# Patient Record
Sex: Male | Born: 1970 | Race: Black or African American | Hispanic: No | Marital: Single | Smoking: Current every day smoker
Health system: Southern US, Community
[De-identification: ages and names within clinical notes are randomized; demographics above are authoritative.]

---

## 2003-10-15 ENCOUNTER — Emergency Department (HOSPITAL_COMMUNITY): Admission: EM | Admit: 2003-10-15 | Discharge: 2003-10-15 | Payer: Self-pay | Admitting: Emergency Medicine

## 2003-11-17 ENCOUNTER — Emergency Department (HOSPITAL_COMMUNITY): Admission: EM | Admit: 2003-11-17 | Discharge: 2003-11-18 | Payer: Self-pay | Admitting: *Deleted

## 2003-12-10 ENCOUNTER — Emergency Department (HOSPITAL_COMMUNITY): Admission: EM | Admit: 2003-12-10 | Discharge: 2003-12-10 | Payer: Self-pay | Admitting: Emergency Medicine

## 2007-02-18 ENCOUNTER — Emergency Department (HOSPITAL_COMMUNITY): Admission: EM | Admit: 2007-02-18 | Discharge: 2007-02-18 | Payer: Self-pay | Admitting: Emergency Medicine

## 2007-09-09 ENCOUNTER — Emergency Department (HOSPITAL_COMMUNITY): Admission: EM | Admit: 2007-09-09 | Discharge: 2007-09-09 | Payer: Self-pay | Admitting: Emergency Medicine

## 2009-01-08 ENCOUNTER — Emergency Department (HOSPITAL_COMMUNITY): Admission: EM | Admit: 2009-01-08 | Discharge: 2009-01-08 | Payer: Self-pay | Admitting: Pediatrics

## 2011-05-13 ENCOUNTER — Emergency Department (HOSPITAL_COMMUNITY)
Admission: EM | Admit: 2011-05-13 | Discharge: 2011-05-13 | Disposition: A | Discharge status: Home / Self Care | Payer: Self-pay | Attending: Emergency Medicine | Admitting: Emergency Medicine

## 2011-05-13 ENCOUNTER — Emergency Department (HOSPITAL_COMMUNITY): Payer: Self-pay

## 2011-05-13 ENCOUNTER — Encounter (HOSPITAL_COMMUNITY): Payer: Self-pay | Admitting: Emergency Medicine

## 2011-05-13 DIAGNOSIS — K089 Disorder of teeth and supporting structures, unspecified: Secondary | ICD-10-CM | POA: Insufficient documentation

## 2011-05-13 DIAGNOSIS — F17200 Nicotine dependence, unspecified, uncomplicated: Secondary | ICD-10-CM | POA: Insufficient documentation

## 2011-05-13 DIAGNOSIS — K029 Dental caries, unspecified: Secondary | ICD-10-CM | POA: Insufficient documentation

## 2011-05-13 DIAGNOSIS — K047 Periapical abscess without sinus: Secondary | ICD-10-CM | POA: Insufficient documentation

## 2011-05-13 MED ORDER — PENICILLIN V POTASSIUM 250 MG PO TABS
500.0000 mg | ORAL_TABLET | Freq: Once | ORAL | Status: AC
  Administered 2011-05-13: 500 mg via ORAL
  Filled 2011-05-13: qty 2

## 2011-05-13 MED ORDER — HYDROCODONE-ACETAMINOPHEN 5-325 MG PO TABS: 2.0000 | ORAL_TABLET | ORAL | Status: AC | PRN

## 2011-05-13 MED ORDER — PENICILLIN V POTASSIUM 500 MG PO TABS: 500.0000 mg | ORAL_TABLET | Freq: Four times a day (QID) | ORAL | Status: AC

## 2011-05-13 MED ORDER — OXYCODONE-ACETAMINOPHEN 5-325 MG PO TABS
1.0000 | ORAL_TABLET | Freq: Once | ORAL | Status: AC
  Administered 2011-05-13: 1 via ORAL
  Filled 2011-05-13: qty 1

## 2011-05-13 NOTE — ED Notes (Signed)
Pt sts dental abscess to right lower side that has purulent discharge starting yesterday; pt c/o pain on side of face

## 2011-05-13 NOTE — ED Provider Notes (Signed)
History     CSN: [id number]  Arrival date & time 05/13/11  6213   First MD Initiated Contact with Patient 05/13/11 0800      Chief Complaint  Patient presents with  . Dental Pain  . Abscess    (Consider location/radiation/quality/duration/timing/severity/associated sxs/prior treatment) HPI  41 year old male presenting to the ED with chief complaints of dental abscess. Patient states for the past 2 days he has been experiencing pain to the right side of his face. Pain initially started at his right upper premolar. Patient describes as sharp, and throbbing. Pain eventually affecting his gum and then radiating upward to the sinuses. He also noticed abnormal taste in mouth from the drainage which he thinks likely pus. Pain has gotten worse and with associated headache, chills, neck pain, body aches. Increasing pain with chewing or with movement. He has not tried anything to alleviate the symptoms. Patient denies ear pain, sore throat, chest pain, shortness of breath, rash. He denies any recent trauma.  History reviewed. No pertinent past medical history.  History reviewed. No pertinent past surgical history.  History reviewed. No pertinent family history.  History  Substance Use Topics  . Smoking status: Current Everyday Smoker  . Smokeless tobacco: Not on file  . Alcohol Use: Yes      Review of Systems  All other systems reviewed and are negative.    Allergies  Review of patient's allergies indicates no known allergies.  Home Medications  No current outpatient prescriptions on file.  BP 138/81  Pulse 100  Temp(Src) 98.6 F (37 C) (Oral)  Resp 20  SpO2 98%  Physical Exam  Nursing note and vitals reviewed. Constitutional: He appears well-developed and well-nourished. No distress.       Awake, alert, nontoxic appearance  HENT:  Head: Normocephalic and atraumatic.    Right Ear: External ear normal.  Left Ear: External ear normal.  Mouth/Throat: Oropharynx is  clear and moist.    Eyes: Right eye exhibits no discharge. Left eye exhibits no discharge.  Neck: Neck supple.  Pulmonary/Chest: Effort normal. He exhibits no tenderness.  Abdominal: There is no tenderness. There is no rebound.  Musculoskeletal: He exhibits no tenderness.       Baseline ROM, no obvious new focal weakness  Neurological:       Mental status and motor strength appears baseline for patient and situation  Skin: No rash noted.  Psychiatric: He has a normal mood and affect.    ED Course  Procedures (including critical care time)  Labs Reviewed - No data to display No results found.   No diagnosis found.    MDM  The symptoms suggestive of an apical abscess with possible sinus involvement. Patient is currently afebrile. He does have moderate tenderness on percussion to maxillary sinus. Patient has significant dental decay, and some evidence of gingivitis. Panorex x-ray ordered. Penicillin and pain medication given.   10:30 AM Panorex reveals significant dental caries and suspicious finding consistent with periapical abscess to multiple teeth.  Result explained to pt.  Pt is instructed for further f/u with dentist.      Fayrene Helper, PA-C 05/13/11 1031

## 2011-05-13 NOTE — ED Provider Notes (Signed)
Medical screening examination/treatment/procedure(s) were performed by non-physician practitioner and as supervising physician I was immediately available for consultation/collaboration.   Forbes Cellar, MD 05/13/11 1044

## 2011-08-03 ENCOUNTER — Emergency Department (HOSPITAL_COMMUNITY)
Admission: EM | Admit: 2011-08-03 | Discharge: 2011-08-03 | Disposition: A | Discharge status: Home / Self Care | Payer: No Typology Code available for payment source | Attending: Emergency Medicine | Admitting: Emergency Medicine

## 2011-08-03 ENCOUNTER — Encounter (HOSPITAL_COMMUNITY): Payer: Self-pay

## 2011-08-03 ENCOUNTER — Emergency Department (HOSPITAL_COMMUNITY): Payer: No Typology Code available for payment source

## 2011-08-03 DIAGNOSIS — IMO0002 Reserved for concepts with insufficient information to code with codable children: Secondary | ICD-10-CM | POA: Insufficient documentation

## 2011-08-03 DIAGNOSIS — S134XXA Sprain of ligaments of cervical spine, initial encounter: Secondary | ICD-10-CM

## 2011-08-03 DIAGNOSIS — J45909 Unspecified asthma, uncomplicated: Secondary | ICD-10-CM | POA: Insufficient documentation

## 2011-08-03 DIAGNOSIS — S46919A Strain of unspecified muscle, fascia and tendon at shoulder and upper arm level, unspecified arm, initial encounter: Secondary | ICD-10-CM

## 2011-08-03 DIAGNOSIS — Y92410 Unspecified street and highway as the place of occurrence of the external cause: Secondary | ICD-10-CM | POA: Insufficient documentation

## 2011-08-03 MED ORDER — NAPROXEN 375 MG PO TABS: 375.0000 mg | ORAL_TABLET | Freq: Two times a day (BID) | ORAL | Status: DC

## 2011-08-03 MED ORDER — HYDROMORPHONE HCL PF 1 MG/ML IJ SOLN
1.0000 mg | Freq: Once | INTRAMUSCULAR | Status: AC
  Administered 2011-08-03: 1 mg via INTRAMUSCULAR
  Filled 2011-08-03: qty 1

## 2011-08-03 MED ORDER — HYDROCODONE-ACETAMINOPHEN 5-500 MG PO TABS: 1.0000 | ORAL_TABLET | Freq: Four times a day (QID) | ORAL | Status: AC | PRN

## 2011-08-03 MED ORDER — DIAZEPAM 5 MG PO TABS
10.0000 mg | ORAL_TABLET | Freq: Once | ORAL | Status: AC
  Administered 2011-08-03: 10 mg via ORAL
  Filled 2011-08-03: qty 2

## 2011-08-03 MED ORDER — DIAZEPAM 5 MG PO TABS: ORAL_TABLET | ORAL | Status: AC

## 2011-08-03 MED ORDER — ONDANSETRON 4 MG PO TBDP
4.0000 mg | ORAL_TABLET | Freq: Once | ORAL | Status: AC
  Administered 2011-08-03: 4 mg via ORAL
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
## 2011-08-04 NOTE — ED Provider Notes (Signed)
Medical screening examination/treatment/procedure(s) were performed by non-physician practitioner and as supervising physician I was immediately available for consultation/collaboration.   Elmon Shader, MD 08/04/11 0033 

## 2012-04-21 ENCOUNTER — Emergency Department (HOSPITAL_COMMUNITY)
Admission: EM | Admit: 2012-04-21 | Discharge: 2012-04-21 | Disposition: A | Discharge status: Home / Self Care | Payer: Self-pay | Attending: Emergency Medicine | Admitting: Emergency Medicine

## 2012-04-21 ENCOUNTER — Emergency Department (HOSPITAL_COMMUNITY): Payer: Self-pay

## 2012-04-21 ENCOUNTER — Encounter (HOSPITAL_COMMUNITY): Payer: Self-pay | Admitting: *Deleted

## 2012-04-21 DIAGNOSIS — Y9389 Activity, other specified: Secondary | ICD-10-CM | POA: Insufficient documentation

## 2012-04-21 DIAGNOSIS — K029 Dental caries, unspecified: Secondary | ICD-10-CM | POA: Insufficient documentation

## 2012-04-21 DIAGNOSIS — J029 Acute pharyngitis, unspecified: Secondary | ICD-10-CM

## 2012-04-21 DIAGNOSIS — IMO0002 Reserved for concepts with insufficient information to code with codable children: Secondary | ICD-10-CM | POA: Insufficient documentation

## 2012-04-21 DIAGNOSIS — Y9289 Other specified places as the place of occurrence of the external cause: Secondary | ICD-10-CM | POA: Insufficient documentation

## 2012-04-21 DIAGNOSIS — J45909 Unspecified asthma, uncomplicated: Secondary | ICD-10-CM | POA: Insufficient documentation

## 2012-04-21 DIAGNOSIS — H9201 Otalgia, right ear: Secondary | ICD-10-CM

## 2012-04-21 DIAGNOSIS — H9209 Otalgia, unspecified ear: Secondary | ICD-10-CM | POA: Insufficient documentation

## 2012-04-21 DIAGNOSIS — F17200 Nicotine dependence, unspecified, uncomplicated: Secondary | ICD-10-CM | POA: Insufficient documentation

## 2012-04-21 DIAGNOSIS — M542 Cervicalgia: Secondary | ICD-10-CM | POA: Insufficient documentation

## 2012-04-21 DIAGNOSIS — Z79899 Other long term (current) drug therapy: Secondary | ICD-10-CM | POA: Insufficient documentation

## 2012-04-21 DIAGNOSIS — R51 Headache: Secondary | ICD-10-CM | POA: Insufficient documentation

## 2012-04-21 DIAGNOSIS — T169XXA Foreign body in ear, unspecified ear, initial encounter: Secondary | ICD-10-CM | POA: Insufficient documentation

## 2012-04-21 MED ORDER — HYDROCODONE-ACETAMINOPHEN 7.5-500 MG/15ML PO SOLN
10.0000 mL | Freq: Once | ORAL | Status: AC
  Administered 2012-04-21: 10 mL via ORAL
  Filled 2012-04-21: qty 15

## 2012-04-21 MED ORDER — HYDROCODONE-ACETAMINOPHEN 7.5-500 MG/15ML PO SOLN: 15.0000 mL | Freq: Four times a day (QID) | ORAL | Status: DC | PRN
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
## 2012-04-21 NOTE — ED Provider Notes (Signed)
Medical screening examination/treatment/procedure(s) were performed by non-physician practitioner and as supervising physician I was immediately available for consultation/collaboration.   Flint Melter, MD 04/21/12 2325
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
## 2012-10-29 ENCOUNTER — Encounter (HOSPITAL_COMMUNITY): Payer: Self-pay | Admitting: Emergency Medicine

## 2012-10-29 ENCOUNTER — Emergency Department (HOSPITAL_COMMUNITY): Payer: Self-pay

## 2012-10-29 ENCOUNTER — Emergency Department (HOSPITAL_COMMUNITY)
Admission: EM | Admit: 2012-10-29 | Discharge: 2012-10-29 | Disposition: A | Discharge status: Home / Self Care | Payer: Self-pay | Attending: Emergency Medicine | Admitting: Emergency Medicine

## 2012-10-29 DIAGNOSIS — J45909 Unspecified asthma, uncomplicated: Secondary | ICD-10-CM | POA: Insufficient documentation

## 2012-10-29 DIAGNOSIS — Y939 Activity, unspecified: Secondary | ICD-10-CM | POA: Insufficient documentation

## 2012-10-29 DIAGNOSIS — S6390XA Sprain of unspecified part of unspecified wrist and hand, initial encounter: Secondary | ICD-10-CM | POA: Insufficient documentation

## 2012-10-29 DIAGNOSIS — K029 Dental caries, unspecified: Secondary | ICD-10-CM

## 2012-10-29 DIAGNOSIS — S63619A Unspecified sprain of unspecified finger, initial encounter: Secondary | ICD-10-CM

## 2012-10-29 DIAGNOSIS — Y929 Unspecified place or not applicable: Secondary | ICD-10-CM | POA: Insufficient documentation

## 2012-10-29 DIAGNOSIS — W010XXA Fall on same level from slipping, tripping and stumbling without subsequent striking against object, initial encounter: Secondary | ICD-10-CM | POA: Insufficient documentation

## 2012-10-29 DIAGNOSIS — Z79899 Other long term (current) drug therapy: Secondary | ICD-10-CM | POA: Insufficient documentation

## 2012-10-29 DIAGNOSIS — K089 Disorder of teeth and supporting structures, unspecified: Secondary | ICD-10-CM | POA: Insufficient documentation

## 2012-10-29 DIAGNOSIS — F17200 Nicotine dependence, unspecified, uncomplicated: Secondary | ICD-10-CM | POA: Insufficient documentation

## 2012-10-29 MED ORDER — ETODOLAC 500 MG PO TABS: 500.0000 mg | ORAL_TABLET | Freq: Two times a day (BID) | ORAL | Status: DC

## 2012-10-29 MED ORDER — HYDROCODONE-ACETAMINOPHEN 5-325 MG PO TABS
1.0000 | ORAL_TABLET | Freq: Once | ORAL | Status: AC
  Administered 2012-10-29: 1 via ORAL
  Filled 2012-10-29: qty 1

## 2012-10-29 MED ORDER — PENICILLIN V POTASSIUM 500 MG PO TABS: 500.0000 mg | ORAL_TABLET | Freq: Three times a day (TID) | ORAL | Status: DC

## 2012-10-29 NOTE — ED Provider Notes (Signed)
History    CSN: [id number] Arrival date & time 10/29/12  1022 First MD Initiated Contact with Patient 10/29/12 1030     Chief Complaint  Patient presents with  . Dental Pain  . Finger Injury    HPI Patient presents to the emergency room primarily with complaints of finger pain. Patient states he tripped and fell yesterday catching himself with his right hand. Since that fall he has experienced pain in his right thumb as well as his right ring finger. Pain is moderate. Movement and palpation increases the discomfort. He has not noticed any particular swelling. Is not any numbness.  Patient also mentions having trouble with a toothache for the last several weeks. He has not seen a dentist for this trouble.  He does not have any difficulty swallowing or breathing. He has not noticed any facial swelling. He feels like all his teeth are sore as well as his gums  Past Medical History  Diagnosis Date  . Asthma    History reviewed. No pertinent past surgical history. No family history on file. History  Substance Use Topics  . Smoking status: Current Every Day Smoker -- 0.25 packs/day    Types: Cigarettes  . Smokeless tobacco: Not on file  . Alcohol Use: Yes     Comment: occasionally    Review of Systems  All other systems reviewed and are negative.    Allergies  Bee venom and Shrimp  Home Medications   Current Outpatient Rx  Name  Route  Sig  Dispense  Refill  . albuterol (PROVENTIL HFA;VENTOLIN HFA) 108 (90 BASE) MCG/ACT inhaler   Inhalation   Inhale 2 puffs into the lungs every 6 (six) hours as needed. For shortness of breath.         . etodolac (LODINE) 500 MG tablet   Oral   Take 1 tablet (500 mg total) by mouth 2 (two) times daily.   20 tablet   0   . penicillin v potassium (VEETID) 500 MG tablet   Oral   Take 1 tablet (500 mg total) by mouth 3 (three) times daily.   30 tablet   0    BP 129/89  Pulse 64  Temp(Src) 97.6 F (36.4 C) (Oral)  Resp 20  SpO2  97% Physical Exam  Nursing note and vitals reviewed. Constitutional: He appears well-developed and well-nourished. No distress.  HENT:  Head: Normocephalic and atraumatic.  Right Ear: External ear normal.  Left Ear: External ear normal.  Mouth/Throat: Oropharynx is clear and moist and mucous membranes are normal. No oral lesions. Dental caries present. No dental abscesses or edematous.  Eyes: Conjunctivae are normal. Right eye exhibits no discharge. Left eye exhibits no discharge. No scleral icterus.  Neck: Neck supple. No tracheal deviation present.  Cardiovascular: Normal rate.   Pulmonary/Chest: Effort normal. No stridor. No respiratory distress.  Musculoskeletal: He exhibits no edema.       Hands: Neurological: He is alert. Cranial nerve deficit: no gross deficits.  Skin: Skin is warm and dry. No rash noted.  Psychiatric: He has a normal mood and affect.    ED Course  Procedures (including critical care time) Labs Reviewed - No data to display Dg Hand Complete Right  10/29/2012   *RADIOLOGY REPORT*  Clinical Data: Pain post trauma  RIGHT HAND - COMPLETE 3+ VIEW  Comparison: None.  Findings: Frontal, oblique, and lateral views were obtained.  There is no fracture or dislocation.  Joint spaces appear intact.  No erosive change.  IMPRESSION: No abnormality noted.   Original Report Authenticated By: Bretta Bang, M.D.   1. Finger sprain, initial encounter   2. Dental caries     MDM  Will dc home on NSAIDS for pain.  Refer to dental for further evaluation.  Finger injury consistent with sprain.  Celene Kras, MD 10/29/12 [phone]

## 2012-10-29 NOTE — Progress Notes (Signed)
[facility] has seen patient and provided him with a oc application. °

## 2012-10-29 NOTE — ED Notes (Signed)
Pt states that he was falling yesterday and caught himself using the rt hand and has pain to rt thumb and ring finger, also c/o toothpain

## 2012-11-12 ENCOUNTER — Encounter (HOSPITAL_COMMUNITY): Payer: Self-pay | Admitting: Family Medicine

## 2012-11-12 ENCOUNTER — Emergency Department (HOSPITAL_COMMUNITY)
Admission: EM | Admit: 2012-11-12 | Discharge: 2012-11-12 | Disposition: A | Discharge status: Home / Self Care | Payer: Self-pay | Attending: Emergency Medicine | Admitting: Emergency Medicine

## 2012-11-12 DIAGNOSIS — K047 Periapical abscess without sinus: Secondary | ICD-10-CM

## 2012-11-12 DIAGNOSIS — K0889 Other specified disorders of teeth and supporting structures: Secondary | ICD-10-CM

## 2012-11-12 DIAGNOSIS — K044 Acute apical periodontitis of pulpal origin: Secondary | ICD-10-CM | POA: Insufficient documentation

## 2012-11-12 DIAGNOSIS — Z79899 Other long term (current) drug therapy: Secondary | ICD-10-CM | POA: Insufficient documentation

## 2012-11-12 DIAGNOSIS — K089 Disorder of teeth and supporting structures, unspecified: Secondary | ICD-10-CM | POA: Insufficient documentation

## 2012-11-12 DIAGNOSIS — J45909 Unspecified asthma, uncomplicated: Secondary | ICD-10-CM | POA: Insufficient documentation

## 2012-11-12 DIAGNOSIS — F17200 Nicotine dependence, unspecified, uncomplicated: Secondary | ICD-10-CM | POA: Insufficient documentation

## 2012-11-12 MED ORDER — CLINDAMYCIN HCL 300 MG PO CAPS
300.0000 mg | ORAL_CAPSULE | Freq: Four times a day (QID) | ORAL | Status: DC
Start: 2012-11-12 — End: 2013-01-20

## 2012-11-12 MED ORDER — OXYCODONE-ACETAMINOPHEN 5-325 MG PO TABS: 1.0000 | ORAL_TABLET | Freq: Four times a day (QID) | ORAL | Status: DC | PRN

## 2012-11-12 MED ORDER — OXYCODONE-ACETAMINOPHEN 5-325 MG PO TABS
2.0000 | ORAL_TABLET | Freq: Once | ORAL | Status: AC
  Administered 2012-11-12: 2 via ORAL
  Filled 2012-11-12: qty 2

## 2012-11-12 NOTE — ED Notes (Signed)
Per pt sts upper and lower tooth broke off today and now he is having 10/10 pain.

## 2012-11-12 NOTE — ED Provider Notes (Signed)
History    This chart was scribed for non-physician practitioner  Trevor Mace, PA-C, working with Laray Anger, DO by Donne Anon, ED Scribe. This patient was seen in room TR05C/TR05C and the patient's care was started at 1759.  CSN: [id number] Arrival date & time 11/12/12  1746  First MD Initiated Contact with Patient 11/12/12 1759     Chief Complaint  Patient presents with  . Dental Pain    The history is provided by the patient. No language interpreter was used.   HPI Comments: Casey Swanson is a 42 y.o. male who presents to the Emergency Department complaining of a few weeks of gradual onset, suddenly worsening of dental pain that he rates 10/10 pain. He states while he was eating lunch today a tooth on the top and bottom of the left side of his mouth broke off today. He was seen in [facility] ED a few weeks ago for the same complaint, and was prescribed an antibiotic, which he reports he was complaint with. He denies difficulty swallowing or any other pain.  He does not currently have a dentist.  Past Medical History  Diagnosis Date  . Asthma    History reviewed. No pertinent past surgical history. History reviewed. No pertinent family history. History  Substance Use Topics  . Smoking status: Current Every Day Smoker -- 0.25 packs/day    Types: Cigarettes  . Smokeless tobacco: Not on file  . Alcohol Use: Yes     Comment: occasionally    Review of Systems  HENT: Positive for dental problem. Negative for trouble swallowing.   All other systems reviewed and are negative.    Allergies  Bee venom and Shrimp  Home Medications   Current Outpatient Rx  Name  Route  Sig  Dispense  Refill  . albuterol (PROVENTIL HFA;VENTOLIN HFA) 108 (90 BASE) MCG/ACT inhaler   Inhalation   Inhale 2 puffs into the lungs every 6 (six) hours as needed. For shortness of breath.         . etodolac (LODINE) 500 MG tablet   Oral   Take 1 tablet (500 mg total) by mouth 2 (two)  times daily.   20 tablet   0   . penicillin v potassium (VEETID) 500 MG tablet   Oral   Take 1 tablet (500 mg total) by mouth 3 (three) times daily.   30 tablet   0    BP 144/89  Pulse 69  Temp(Src) 98.3 F (36.8 C) (Oral)  Resp 18  SpO2 93% Physical Exam  Nursing note and vitals reviewed. Constitutional: He is oriented to person, place, and time. He appears well-developed and well-nourished. No distress.  HENT:  Head: Normocephalic and atraumatic.  Half of 3rd upper molar is missing with surrounding edema and erythema. No abscess.  Eyes: Conjunctivae and EOM are normal.  Neck: Normal range of motion. Neck supple.  Cardiovascular: Normal rate.   Pulmonary/Chest: Effort normal. No respiratory distress.  Musculoskeletal: Normal range of motion. He exhibits no edema.  Neurological: He is alert and oriented to person, place, and time.  Skin: Skin is warm and dry.  Psychiatric: He has a normal mood and affect. His behavior is normal.    ED Course  Procedures (including critical care time) DIAGNOSTIC STUDIES: Oxygen Saturation is 93% on RA, adequate by my interpretation.    COORDINATION OF CARE: 6:06 PM Discussed treatment plan which includes antibiotics, pain medication and dental referral with pt at bedside and pt  agreed to plan.    Labs Reviewed - No data to display No results found. 1. Pain, dental   2. Dental infection     MDM   Dental pain associated with dental infection. No evidence of dental abscess. Patient is afebrile, non toxic appearing and swallowing secretions well. I gave patient referral to dentist and stressed the importance of dental follow up for ultimate management of dental pain. I will also give clindamycin and pain control. He was previously given penicillin. Patient voices understanding and is agreeable to plan.      I personally performed the services described in this documentation, which was scribed in my presence. The recorded information  has been reviewed and is accurate.   Trevor Mace, PA-C 11/12/12 [phone]

## 2012-11-13 NOTE — ED Provider Notes (Signed)
Medical screening examination/treatment/procedure(s) were performed by non-physician practitioner and as supervising physician I was immediately available for consultation/collaboration.   Jadia Capers M Tremon Sainvil, DO 11/13/12 1550 

## 2013-01-20 ENCOUNTER — Emergency Department (HOSPITAL_COMMUNITY)
Admission: EM | Admit: 2013-01-20 | Discharge: 2013-01-20 | Disposition: A | Discharge status: Home / Self Care | Payer: No Typology Code available for payment source | Attending: Emergency Medicine | Admitting: Emergency Medicine

## 2013-01-20 ENCOUNTER — Encounter (HOSPITAL_COMMUNITY): Payer: Self-pay | Admitting: Emergency Medicine

## 2013-01-20 DIAGNOSIS — K029 Dental caries, unspecified: Secondary | ICD-10-CM | POA: Insufficient documentation

## 2013-01-20 DIAGNOSIS — R51 Headache: Secondary | ICD-10-CM | POA: Insufficient documentation

## 2013-01-20 DIAGNOSIS — F17200 Nicotine dependence, unspecified, uncomplicated: Secondary | ICD-10-CM | POA: Insufficient documentation

## 2013-01-20 DIAGNOSIS — R509 Fever, unspecified: Secondary | ICD-10-CM | POA: Insufficient documentation

## 2013-01-20 DIAGNOSIS — Z79899 Other long term (current) drug therapy: Secondary | ICD-10-CM | POA: Insufficient documentation

## 2013-01-20 DIAGNOSIS — K089 Disorder of teeth and supporting structures, unspecified: Secondary | ICD-10-CM | POA: Insufficient documentation

## 2013-01-20 DIAGNOSIS — R220 Localized swelling, mass and lump, head: Secondary | ICD-10-CM | POA: Insufficient documentation

## 2013-01-20 DIAGNOSIS — J45909 Unspecified asthma, uncomplicated: Secondary | ICD-10-CM | POA: Insufficient documentation

## 2013-01-20 MED ORDER — OXYCODONE-ACETAMINOPHEN 10-325 MG PO TABS: 1.0000 | ORAL_TABLET | ORAL | Status: DC | PRN

## 2013-01-20 MED ORDER — AMOXICILLIN 500 MG PO CAPS: 500.0000 mg | ORAL_CAPSULE | Freq: Three times a day (TID) | ORAL | Status: DC

## 2013-01-20 MED ORDER — OXYCODONE-ACETAMINOPHEN 5-325 MG PO TABS
2.0000 | ORAL_TABLET | Freq: Once | ORAL | Status: AC
  Administered 2013-01-20: 2 via ORAL
  Filled 2013-01-20: qty 2

## 2013-01-20 NOTE — ED Notes (Signed)
Pt c/o of toothache x3 days. States that he had a filling in tooth that has come out. Pain 10/10 left side.

## 2013-01-20 NOTE — ED Provider Notes (Signed)
CSN: [id number]     Arrival date & time 01/20/13  1421 History  This chart was scribed for non-physician practitioner Arthor Captain, PA-C, working with Shon Baton, MD by Dorothey Baseman, ED Scribe. This patient was seen in room WTR5/WTR5 and the patient's care was started at [facility] PM.    Chief Complaint  Patient presents with  . Dental Pain   Patient is a 42 y.o. male presenting with tooth pain. The history is provided by the patient. No language interpreter was used.  Dental Pain Location:  Lower Severity:  Severe Onset quality:  Sudden Timing:  Constant Chronicity:  New Context: crown fracture   Previous work-up:  Filled cavity Relieved by: Marlin Canary powder. Worsened by:  Cold food/drink and hot food/drink Associated symptoms: facial swelling, fever ( subjective) and headaches   Associated symptoms: no difficulty swallowing    HPI Comments: Casey Swanson is a 42 y.o. male who presents to the Emergency Department complaining of constant left-sided, lower dental pain, currently a 9/10, onset 3 days ago secondary to cracking a filling while eating. He states that the pain is exacerbated with eating and hot and cold food/liquids. He reports that the pain intermittently radiates to the right side. He reports associated facial swelling, subjective fever, and headaches. He states that he took a Goody powder at home with mild, temporary relief. He denies any difficulty swallowing.   Past Medical History  Diagnosis Date  . Asthma    History reviewed. No pertinent past surgical history. History reviewed. No pertinent family history. History  Substance Use Topics  . Smoking status: Current Every Day Smoker -- 0.25 packs/day    Types: Cigarettes  . Smokeless tobacco: Not on file  . Alcohol Use: Yes     Comment: occasionally    Review of Systems  Constitutional: Positive for fever ( subjective).  HENT: Positive for facial swelling. Negative for sore throat, trouble swallowing and voice  change.   Respiratory: Negative for stridor.   Neurological: Positive for headaches.  All other systems reviewed and are negative.    Allergies  Bee venom and Shrimp  Home Medications   Current Outpatient Rx  Name  Route  Sig  Dispense  Refill  . albuterol (PROVENTIL HFA;VENTOLIN HFA) 108 (90 BASE) MCG/ACT inhaler   Inhalation   Inhale 2 puffs into the lungs every 6 (six) hours as needed. For shortness of breath.         . clindamycin (CLEOCIN) 300 MG capsule   Oral   Take 1 capsule (300 mg total) by mouth 4 (four) times daily. X 7 days   28 capsule   0   . etodolac (LODINE) 500 MG tablet   Oral   Take 1 tablet (500 mg total) by mouth 2 (two) times daily.   20 tablet   0   . oxyCODONE-acetaminophen (PERCOCET) 5-325 MG per tablet   Oral   Take 1-2 tablets by mouth every 6 (six) hours as needed for pain.   10 tablet   0    Triage Vitals: BP 145/85  Pulse 65  Temp(Src) 98.9 F (37.2 C) (Oral)  Resp 18  SpO2 99%  Physical Exam  Nursing note and vitals reviewed. Constitutional: He is oriented to person, place, and time. He appears well-developed and well-nourished. No distress.  HENT:  Head: Normocephalic and atraumatic.  Dentition is poor. Large hole to the last, left, lower molar. Diffuse gingival retraction.   Eyes: Conjunctivae are normal.  Neck: Normal range of  motion. Neck supple.  Musculoskeletal: Normal range of motion.  Neurological: He is alert and oriented to person, place, and time.  Skin: Skin is warm and dry.  Psychiatric: He has a normal mood and affect. His behavior is normal.    ED Course  Procedures (including critical care time)  Medications  oxyCODONE-acetaminophen (PERCOCET/ROXICET) 5-325 MG per tablet 2 tablet (2 tablets Oral Given 01/20/13 1545)   DIAGNOSTIC STUDIES: Oxygen Saturation is 99% on room air, normal by my interpretation.    COORDINATION OF CARE: 3:29PM- Will order an injection of bupivacaine to numb the tooth. Will  order antibiotics and pain medication. Will refer patient to a dentist and advised him to follow up if there are any new or worsening symptoms. Discussed treatment plan with patient at bedside and patient verbalized agreement.   Dental Performed by: Arthor Captain Authorized by: Arthor Captain Consent: Verbal consent obtained. Patient understanding: patient states understanding of the procedure being performed Patient identity confirmed: verbally with patient Local anesthesia used: yes Local anesthetic: bupivacaine 0.5% with epinephrine Anesthetic total:0.3 ml Patient sedated: no Patient tolerance: Patient tolerated the procedure well with no immediate complications.     Labs Review Labs Reviewed - No data to display Imaging Review No results found.  MDM   1. Pain due to dental caries    Patient with toothache.  No gross abscess.  Exam unconcerning for Ludwig's angina or spread of infection.  Will treat with penicillin and pain medicine.  Urged patient to follow-up with dentist.     I personally performed the services described in this documentation, which was scribed in my presence. The recorded information has been reviewed and is accurate.     Arthor Captain, PA-C 01/23/13 2057

## 2013-01-24 NOTE — ED Provider Notes (Signed)
Medical screening examination/treatment/procedure(s) were performed by non-physician practitioner and as supervising physician I was immediately available for consultation/collaboration.  Shon Baton, MD 01/24/13 2127

## 2013-03-13 ENCOUNTER — Encounter (HOSPITAL_COMMUNITY): Payer: Self-pay | Admitting: Emergency Medicine

## 2013-03-13 ENCOUNTER — Emergency Department (HOSPITAL_COMMUNITY)
Admission: EM | Admit: 2013-03-13 | Discharge: 2013-03-13 | Disposition: A | Discharge status: Home / Self Care | Payer: No Typology Code available for payment source | Attending: Emergency Medicine | Admitting: Emergency Medicine

## 2013-03-13 DIAGNOSIS — Z79899 Other long term (current) drug therapy: Secondary | ICD-10-CM | POA: Insufficient documentation

## 2013-03-13 DIAGNOSIS — J45909 Unspecified asthma, uncomplicated: Secondary | ICD-10-CM | POA: Insufficient documentation

## 2013-03-13 DIAGNOSIS — K029 Dental caries, unspecified: Secondary | ICD-10-CM | POA: Insufficient documentation

## 2013-03-13 DIAGNOSIS — F17200 Nicotine dependence, unspecified, uncomplicated: Secondary | ICD-10-CM | POA: Insufficient documentation

## 2013-03-13 DIAGNOSIS — K0889 Other specified disorders of teeth and supporting structures: Secondary | ICD-10-CM

## 2013-03-13 DIAGNOSIS — K089 Disorder of teeth and supporting structures, unspecified: Secondary | ICD-10-CM | POA: Insufficient documentation

## 2013-03-13 MED ORDER — OXYCODONE-ACETAMINOPHEN 5-325 MG PO TABS: 1.0000 | ORAL_TABLET | Freq: Four times a day (QID) | ORAL | Status: DC | PRN

## 2013-03-13 MED ORDER — PENICILLIN V POTASSIUM 500 MG PO TABS: 500.0000 mg | ORAL_TABLET | Freq: Three times a day (TID) | ORAL | Status: DC

## 2013-03-13 NOTE — ED Provider Notes (Signed)
CSN: [id number]     Arrival date & time 03/13/13  1409 History  This chart was scribed for non-physician practitioner, Casey Pel, PA-C working with Casey Swanson, ED scribe. This patient was seen in room WTR9/WTR9 and the patient's care was started at 2:19 PM.   Casey chief complaint on file.  The history is provided by the patient. Casey language interpreter was used.   HPI Comments: Casey Swanson is a 42 y.o. male who presents to the Emergency Department complaining of dental pain that started 3 days ago when his filling fell out. He has a hx of the same and has been unable to pay for dental care. He has not had any nausea, vomiting, diarrhea, fevers, neck pain. He has tried naprosyn OTC for pain but nothing is helpnig.  Past Medical History  Diagnosis Date  . Asthma    Casey past surgical history on file. Casey family history on file. History  Substance Use Topics  . Smoking status: Current Every Day Smoker -- 0.25 packs/day    Types: Cigarettes  . Smokeless tobacco: Not on file  . Alcohol Use: Yes     Comment: occasionally    Review of Systems  All other systems reviewed and are negative.    Allergies  Bee venom and Shrimp  Home Medications   Current Outpatient Rx  Name  Route  Sig  Dispense  Refill  . albuterol (PROVENTIL HFA;VENTOLIN HFA) 108 (90 BASE) MCG/ACT inhaler   Inhalation   Inhale 2 puffs into the lungs every 6 (six) hours as needed. For shortness of breath.         Marland Kitchen amoxicillin (AMOXIL) 500 MG capsule   Oral   Take 1 capsule (500 mg total) by mouth 3 (three) times daily.   30 capsule   0   . Aspirin-Acetaminophen-Caffeine (GOODYS EXTRA STRENGTH) 500-325-65 MG PACK   Oral   Take 1 Package by mouth every 4 (four) hours as needed (pain).         Marland Kitchen HYDROcodone-acetaminophen (NORCO) 7.5-325 MG per tablet   Oral   Take 1 tablet by mouth every 6 (six) hours as needed for pain.         Marland Kitchen oxyCODONE-acetaminophen (PERCOCET)  10-325 MG per tablet   Oral   Take 1 tablet by mouth every 4 (four) hours as needed for pain.   20 tablet   0    There were Casey vitals taken for this visit.  Physical Exam  Nursing note and vitals reviewed. Constitutional: He is oriented to person, place, and time. He appears well-developed and well-nourished. Casey distress.  HENT:  Head: Normocephalic and atraumatic.  Mouth/Throat: Dental caries present.    Eyes: Conjunctivae and EOM are normal. Pupils are equal, round, and reactive to light.  Neck: Normal range of motion. Neck supple. Casey tracheal deviation present.  Cardiovascular: Normal rate and regular rhythm.   Pulmonary/Chest: Effort normal and breath sounds normal. Casey respiratory distress.  Musculoskeletal: Normal range of motion.  Neurological: He is alert and oriented to person, place, and time.  Skin: Skin is warm and dry.  Psychiatric: He has a normal mood and affect. His behavior is normal.    ED Course  Procedures (including critical care time)    Labs Review Labs Reviewed - Casey data to display Imaging Review Casey results found.  EKG Interpretation   None       MDM  Casey diagnosis found. Patient has dental pain. Casey  emergent s/sx's present. Patent airway. Casey trismus.  Will be given pain medication and antibiotics. I discussed the need to call dentist within 24/48 hours for follow-up. Dental referral given. Return to ED precautions given.  Pt voiced understanding and has agreed to follow-up.   42 y.o.Casey Swanson's evaluation in the Emergency Department is complete. It has been determined that Casey acute conditions requiring further emergency intervention are present at this time. The patient/guardian have been advised of the diagnosis and plan. We have discussed signs and symptoms that warrant return to the ED, such as changes or worsening in symptoms.  Vital signs are stable at discharge. Filed Vitals:   03/13/13 1444  BP: 138/87  Pulse: 84  Temp: 98.1 F  (36.7 C)  Resp: 18    Patient/guardian has voiced understanding and agreed to follow-up with the PCP or specialist.  I personally performed the services described in this documentation, which was scribed in my presence. The recorded information has been reviewed and is accurate.   Casey Matas, PA-C 03/13/13 1453

## 2013-03-13 NOTE — ED Notes (Signed)
Pain in l/side of mouth x 3 days. Hx of broken tooth

## 2013-03-13 NOTE — Progress Notes (Signed)
[facility] provided pt with a list of primary care resources, [facility] Orange Card application, and dental resources.  °

## 2013-03-13 NOTE — ED Provider Notes (Signed)
Medical screening examination/treatment/procedure(s) were performed by non-physician practitioner and as supervising physician I was immediately available for consultation/collaboration.  EKG Interpretation   None        Fritzie Prioleau F Massiah Minjares, MD 03/13/13 1625 

## 2013-06-11 ENCOUNTER — Encounter (HOSPITAL_COMMUNITY): Payer: Self-pay | Admitting: Emergency Medicine

## 2013-06-11 ENCOUNTER — Emergency Department (HOSPITAL_COMMUNITY)
Admission: EM | Admit: 2013-06-11 | Discharge: 2013-06-11 | Disposition: A | Discharge status: Home / Self Care | Payer: No Typology Code available for payment source | Attending: Emergency Medicine | Admitting: Emergency Medicine

## 2013-06-11 DIAGNOSIS — K029 Dental caries, unspecified: Secondary | ICD-10-CM | POA: Insufficient documentation

## 2013-06-11 DIAGNOSIS — K0889 Other specified disorders of teeth and supporting structures: Secondary | ICD-10-CM

## 2013-06-11 DIAGNOSIS — J45909 Unspecified asthma, uncomplicated: Secondary | ICD-10-CM | POA: Insufficient documentation

## 2013-06-11 DIAGNOSIS — K089 Disorder of teeth and supporting structures, unspecified: Secondary | ICD-10-CM | POA: Insufficient documentation

## 2013-06-11 DIAGNOSIS — Z79899 Other long term (current) drug therapy: Secondary | ICD-10-CM | POA: Insufficient documentation

## 2013-06-11 DIAGNOSIS — F17200 Nicotine dependence, unspecified, uncomplicated: Secondary | ICD-10-CM | POA: Insufficient documentation

## 2013-06-11 MED ORDER — PENICILLIN V POTASSIUM 500 MG PO TABS: 500.0000 mg | ORAL_TABLET | Freq: Four times a day (QID) | ORAL | Status: DC

## 2013-06-11 MED ORDER — ONDANSETRON 8 MG PO TBDP
8.0000 mg | ORAL_TABLET | Freq: Once | ORAL | Status: AC
  Administered 2013-06-11: 8 mg via ORAL
  Filled 2013-06-11: qty 1

## 2013-06-11 MED ORDER — OXYCODONE-ACETAMINOPHEN 5-325 MG PO TABS
2.0000 | ORAL_TABLET | Freq: Once | ORAL | Status: AC
  Administered 2013-06-11: 2 via ORAL
  Filled 2013-06-11: qty 2

## 2013-06-11 MED ORDER — OXYCODONE-ACETAMINOPHEN 5-325 MG PO TABS: 1.0000 | ORAL_TABLET | Freq: Four times a day (QID) | ORAL | Status: DC | PRN

## 2013-06-11 NOTE — Discharge Instructions (Signed)
Dental Pain Toothache is pain in or around a tooth. It may get worse with chewing or with cold or heat.  HOME CARE  Your dentist may use a numbing medicine during treatment. If so, you may need to avoid eating until the medicine wears off. Ask your dentist about this.  Only take medicine as told by your dentist or doctor.  Avoid chewing food near the painful tooth until after all treatment is done. Ask your dentist about this. GET HELP RIGHT AWAY IF:   The problem gets worse or new problems appear.  You have a fever.  There is redness and puffiness (swelling) of the face, jaw, or neck.  You cannot open your mouth.  There is pain in the jaw.  There is very bad pain that is not helped by medicine. MAKE SURE YOU:   Understand these instructions.  Will watch your condition.  Will get help right away if you are not doing well or get worse. Document Released: 10/04/2007 Document Revised: 07/10/2011 Document Reviewed: 10/04/2007 [facility]ExitCare Patient Information 2014 [place], [place].  Tooth Injuries The 3 most common tooth injuries are 1) fracture, 2) luxation (dislocated), and 3) avulsion (entire tooth comes out). Fracture. A fracture usually splits the tooth into 2 or more parts. Part of the tooth stays attached to the socket and 1 or more pieces of the tooth break free.  When the flesh inside the tooth (pulp) is injured, it can be identified by bleeding at the site or a pink or red dot in the dentin. Dentin is the yellowish second layer usually covered by enamel. Problems involving the dentin may be painful because the junction of the enamel and dentin is very sensitive. Limiting exposure of air, fluid in the mouth (saliva), temperature changes, and the tongue to tooth pulp will decrease the pain.  Fractures may be classified as:  Crown fractures. A crown fracture may involve the enamel only or the enamel and dentin. Sometimes crown fractures are broken down as simple (no pulp  involvement) or complex (pulp involvement).  Root fractures. Root fractures almost always involve the pulp.  A combination of both. Luxation. Luxation means the tooth becomes dislocated within the socket but maintains some attachment. There are different types of luxations. They be identified by teeth that appear:  Longer than the surrounding teeth (extruded).  Positioned ahead of or behind the normal tooth row (laterally displaced). With either injury, the tooth should be firmly grasped with a gloved hand and moved into its normal position. If the procedure is too difficult or too painful, the tooth should be left where it is for a dentist to reposition.   Pushed into the gum and appears shorter than the surrounding teeth (intruded). Do not attempt to reposition an intruded tooth. With all luxated teeth, get to a dentist as soon as possible. Avulsion. Avulsion means the entire tooth is removed from its socket. The best outcomes require putting the tooth back in place within 60 minutes. After 2 hours, the chances of saving the tooth are small but getting to a dentist right away can be beneficial. Locate and protect the lost tooth. The tooth will often still be in the mouth, but if it cannot be located, check clothing, and the surrounding area. If dirty, the tooth should be gently cleaned with water or salty water (saline). To make saline combine  teaspoon of table salt in a cup of warm water. The tooth should be handled only on its enamel surface. The root  should be protected from further injury. If the tooth cannot be repositioned into the socket after cleaning, transport the tooth in saliva, distilled water, or milk to the dentist. Avulsed baby (primary) teeth should not be reimplanted. TREATMENT   Gently biting into gauze or a towel will help control the bleeding. An exposed nerve requires dental exam and care.  Tooth fragments should be handled on their enamel surfaces, saved, and sent to the  dental office with the injured person.  Minor fractures, involving only the enamel, usually do not require immediate dental treatment. A tooth can also be loosened by injury with no visible fracture or displacement. A person with this injury should be referred to a dentist for an X-ray exam to look for tooth fractures below the gum line.  Root fractures require joining the fractured tooth to a healthy tooth (splinting) by a dentist as soon as possible. If splinting is not possible, extraction of the remaining tooth may be necessary.  Only take over-the-counter or prescription medicines for pain, discomfort, or fever as directed by your caregiver.  If you are given antibiotics, finish all of them as directed. RISKS AND COMPLICATIONS Complications from tooth injuries can include:  Tooth death.  Tooth loss.  Cosmetic deformity.  Infection. PREVENTION  Mouth guards should be worn in all contact sports. SEEK DENTAL CARE IF:  Pain becomes worse rather than better, or if pain is uncontrolled with medications.  You have increased swelling or redness in your face near the injured tooth.  You have an oral temperature above 102 F (38.9 C) not controlled by medicine.  You cannot open your mouth. Document Released: 01/13/2004 Document Revised: 07/10/2011 Document Reviewed: 07/22/2009 [facility] Patient Information 2014 [place], [place].

## 2013-06-11 NOTE — ED Provider Notes (Signed)
CSN: [id number]     Arrival date & time 06/11/13  0801 History   First MD Initiated Contact with Patient 06/11/13 [phone]     Chief Complaint  Patient presents with  . Dental Pain     (Consider location/radiation/quality/duration/timing/severity/associated sxs/prior Treatment) HPI Comments: Patient is a 43 year old male with history of asthma who presents today for bilateral dental pain. The pain migrates from his left lower teeth into his right upper gums. The pain is achy and throbbing. It radiates into his ears and face. He has broken his tooth and has had issues since. He has slowly been getting his teeth fixed, but has not been able to save up the money to get all his teeth fixed. He is sensitive to air, heat, and cold. He has not been drinking or eating very much due to the pain. He denies fevers, chills, nausea, vomiting, abdominal pain, trismus, shortness of breath, chest pain.   The history is provided by the patient. No language interpreter was used.    Past Medical History  Diagnosis Date  . Asthma    History reviewed. No pertinent past surgical history. History reviewed. No pertinent family history. History  Substance Use Topics  . Smoking status: Current Every Day Smoker -- 0.25 packs/day    Types: Cigarettes  . Smokeless tobacco: Not on file  . Alcohol Use: Yes     Comment: occasionally    Review of Systems  Constitutional: Negative for fever and chills.  HENT: Positive for dental problem. Negative for drooling, ear pain, rhinorrhea, sore throat and trouble swallowing.   Respiratory: Negative for shortness of breath.   Cardiovascular: Negative for chest pain.  Gastrointestinal: Negative for nausea, vomiting and abdominal pain.  All other systems reviewed and are negative.      Allergies  Bee venom and Shrimp  Home Medications   Current Outpatient Rx  Name  Route  Sig  Dispense  Refill  . albuterol (PROVENTIL HFA;VENTOLIN HFA) 108 (90 BASE) MCG/ACT inhaler   Inhalation   Inhale 2 puffs into the lungs every 6 (six) hours as needed. For shortness of breath.          BP 164/95  Pulse 68  Temp(Src) 98.6 F (37 C) (Oral)  Resp 18  SpO2 98% Physical Exam  Nursing note and vitals reviewed. Constitutional: He is oriented to person, place, and time. He appears well-developed and well-nourished. He does not appear ill. No distress.  HENT:  Head: Normocephalic and atraumatic.  Right Ear: External ear normal.  Left Ear: External ear normal.  Nose: Nose normal.  Mouth/Throat: Uvula is midline and oropharynx is clear and moist. Mucous membranes are dry. No oral lesions. No trismus in the jaw. Abnormal dentition. Dental caries present. No dental abscesses or uvula swelling.    No trismus, submental edema, or tongue elevation. No drainable abscess.   Eyes: Conjunctivae are normal.  Neck: Normal range of motion. No tracheal deviation present.  Cardiovascular: Normal rate, regular rhythm and normal heart sounds.   Pulmonary/Chest: Effort normal and breath sounds normal. No stridor.  Abdominal: Soft. He exhibits no distension. There is no tenderness.  Musculoskeletal: Normal range of motion.  Neurological: He is alert and oriented to person, place, and time.  Skin: Skin is warm and dry. He is not diaphoretic.  Psychiatric: He has a normal mood and affect. His behavior is normal.    ED Course  Procedures (including critical care time) Labs Review Labs Reviewed - No data to  display Imaging Review No results found.  EKG Interpretation   None       MDM   Final diagnoses:  Pain, dental   Patient with toothache.  No gross abscess.  Exam unconcerning for Ludwig's angina or spread of infection.  Will treat with penicillin and pain medicine.  Urged patient to follow-up with dentist.       Mora Bellman, PA-C 06/11/13 0930

## 2013-06-11 NOTE — ED Notes (Addendum)
Pt escorted to discharge window. Verbalized understanding discharge instructions. In no acute distress.  Pt educated to not drive or operate heavy machinery while on pain medication.

## 2013-06-11 NOTE — ED Notes (Signed)
Pt c/o bilateral dental pain x 2 days.  Pain score 10/10.  Pt reports that he recently has a tooth removed, but now there are three other teeth hurting.

## 2013-06-12 NOTE — ED Provider Notes (Signed)
Medical screening examination/treatment/procedure(s) were performed by non-physician practitioner and as supervising physician I was immediately available for consultation/collaboration.  EKG Interpretation   None         Dagmar HaitWilliam Tryphena Perkovich, MD 06/12/13 1217

## 2013-12-27 ENCOUNTER — Encounter (HOSPITAL_COMMUNITY): Payer: Self-pay | Admitting: Emergency Medicine

## 2013-12-27 ENCOUNTER — Emergency Department (HOSPITAL_COMMUNITY)
Admission: EM | Admit: 2013-12-27 | Discharge: 2013-12-27 | Disposition: A | Discharge status: Home / Self Care | Payer: No Typology Code available for payment source | Attending: Emergency Medicine | Admitting: Emergency Medicine

## 2013-12-27 DIAGNOSIS — K089 Disorder of teeth and supporting structures, unspecified: Secondary | ICD-10-CM | POA: Insufficient documentation

## 2013-12-27 DIAGNOSIS — G8918 Other acute postprocedural pain: Secondary | ICD-10-CM

## 2013-12-27 DIAGNOSIS — J45909 Unspecified asthma, uncomplicated: Secondary | ICD-10-CM | POA: Insufficient documentation

## 2013-12-27 DIAGNOSIS — Z79899 Other long term (current) drug therapy: Secondary | ICD-10-CM | POA: Insufficient documentation

## 2013-12-27 DIAGNOSIS — Z791 Long term (current) use of non-steroidal anti-inflammatories (NSAID): Secondary | ICD-10-CM | POA: Insufficient documentation

## 2013-12-27 DIAGNOSIS — F17200 Nicotine dependence, unspecified, uncomplicated: Secondary | ICD-10-CM | POA: Insufficient documentation

## 2013-12-27 MED ORDER — PENICILLIN V POTASSIUM 500 MG PO TABS: 500.0000 mg | ORAL_TABLET | Freq: Four times a day (QID) | ORAL | Status: DC

## 2013-12-27 MED ORDER — PENICILLIN V POTASSIUM 500 MG PO TABS
500.0000 mg | ORAL_TABLET | Freq: Once | ORAL | Status: AC
  Administered 2013-12-27: 500 mg via ORAL
  Filled 2013-12-27: qty 1

## 2013-12-27 MED ORDER — OXYCODONE-ACETAMINOPHEN 5-325 MG PO TABS: 1.0000 | ORAL_TABLET | ORAL | Status: DC | PRN

## 2013-12-27 MED ORDER — OXYCODONE-ACETAMINOPHEN 5-325 MG PO TABS
1.0000 | ORAL_TABLET | Freq: Once | ORAL | Status: AC
  Administered 2013-12-27: 1 via ORAL
  Filled 2013-12-27: qty 1

## 2013-12-27 NOTE — ED Provider Notes (Signed)
CSN: [id number]     Arrival date & time 12/27/13  1404 History  This chart was scribed for a non-physician practitioner, Melvenia Beam A. Junius Finner, working with Doug Sou, MD by [place] Peace, ED Scribe. The patient was seen in [facility]. The patient's care was started at 3:45 PM.      Chief Complaint  Patient presents with  . Dental Pain      Patient is a 43 y.o. male presenting with tooth pain. The history is provided by the patient. No language interpreter was used.  Dental Pain Location:  Upper and lower (right side) Severity:  Severe Duration: Today. Context comment:  7 teeth extracted Previous work-up:  Dental exam Associated symptoms: facial pain, gum swelling and oral bleeding   Associated symptoms: no facial swelling, no fever, no neck pain and no neck swelling   Risk factors: lack of dental care and smoking    HPI Comments: Broly Hatfield is a 43 y.o. male who presents to the Emergency Department complaining of dental problem. Pt reports that he went to the free dental clinic today where he had 7 teeth extracted from his right upper jaw and right lower jaw. Pt states that he is here because they were not able to prescribe him pain medication at the clinic. He rates pain currently as 10/10. Pt is current everyday smoker (0.25 packs daily) and admits to occasional alcohol use.    Past Medical History  Diagnosis Date  . Asthma    History reviewed. No pertinent past surgical history. No family history on file. History  Substance Use Topics  . Smoking status: Current Every Day Smoker -- 0.25 packs/day    Types: Cigarettes  . Smokeless tobacco: Not on file  . Alcohol Use: Yes     Comment: occasionally    Review of Systems  Constitutional: Negative for fever and chills.  HENT: Positive for dental problem. Negative for facial swelling.   Musculoskeletal: Negative for neck pain.  All other systems reviewed and are negative.     Allergies  Bee venom and Shrimp  Home  Medications   Prior to Admission medications   Medication Sig Start Date End Date Taking? Authorizing Provider  albuterol (PROVENTIL HFA;VENTOLIN HFA) 108 (90 BASE) MCG/ACT inhaler Inhale 2 puffs into the lungs every 6 (six) hours as needed. For shortness of breath.   Yes Historical Provider, MD  naproxen sodium (ANAPROX) 220 MG tablet Take 220 mg by mouth 2 (two) times daily with a meal.   Yes Historical Provider, MD   BP 167/87  Pulse 56  Temp(Src) 98.3 F (36.8 C) (Oral)  Resp 18  SpO2 99% Physical Exam  Nursing note and vitals reviewed. Constitutional: He is oriented to person, place, and time. He appears well-developed and well-nourished. No distress.  HENT:  Head: Normocephalic and atraumatic.  Mouth/Throat: Oropharynx is clear and moist.  No facial swelling.   Eyes: Conjunctivae and EOM are normal.  Neck: Neck supple. No tracheal deviation present.  Cardiovascular: Normal rate.   Pulmonary/Chest: Effort normal. No respiratory distress.  Musculoskeletal: Normal range of motion.  Lymphadenopathy:    He has no cervical adenopathy.  Neurological: He is alert and oriented to person, place, and time.  Skin: Skin is warm and dry.  Psychiatric: He has a normal mood and affect. His behavior is normal.    ED Course  Procedures (including critical care time) Labs Review Labs Reviewed - No data to display  No results found for this or any previous  visit. No results found.    Imaging Review No results found.   EKG Interpretation None     Medications  oxyCODONE-acetaminophen (PERCOCET/ROXICET) 5-325 MG per tablet 1 tablet (1 tablet Oral Given 12/27/13 1507)  penicillin v potassium (VEETID) tablet 500 mg (500 mg Oral Given 12/27/13 1507)    3:48 PM- Treatment plan was discussed with patient who verbalizes understanding and agrees.   MDM   Final diagnoses:  None    1. Post operative pain  Pain and abx treatment provided. He has follow up for recheck in place.  Stable for discharge.   I personally performed the services described in this documentation, which was scribed in my presence. The recorded information has been reviewed and is accurate.     Arnoldo Hooker, PA-C 12/27/13 2011

## 2013-12-27 NOTE — ED Notes (Signed)
Pt states he went to a free dental clinic today, where he had 7 teeth extracted from his upper and lower right jaw. Pt states he is here because they did not give pain medication at the clinic and he now has 10+/10 pain. Pt A&Ox4.

## 2013-12-27 NOTE — Discharge Instructions (Signed)
Pain Relief Preoperatively and Postoperatively °Being a good patient does not mean being a silent one. If you have questions, problems, or concerns about the pain you may feel after surgery, let your caregiver know. Patients have the right to assessment and management of pain. The treatment of pain after surgery is important to speed up recovery and return to normal activities. Severe pain after surgery, and the fear or anxiety associated with that pain, may cause extreme discomfort that: °· Prevents sleep. °· Decreases the ability to breathe deeply and cough. This can cause pneumonia or other upper airway infections. °· Causes your heart to beat faster and your blood pressure to be higher. °· Increases the risk for constipation and bloating. °· Decreases the ability of wounds to heal. °· May result in depression, increased anxiety, and feelings of helplessness. °Relief of pain before surgery is also important because it will lessen the pain after surgery. Patients who receive both pain relief before and after surgery experience greater pain relief than those who only receive pain relief after surgery. Let your caregiver know if you are having uncontrolled pain. This is very important. Pain after surgery is more difficult to manage if it is permitted to become severe, so prompt and adequate treatment of acute pain is necessary. °PAIN CONTROL METHODS °Your caregivers follow policies and procedures about the management of patient pain. These guidelines should be explained to you before surgery. Plans for pain control after surgery must be mutually decided upon and instituted with your full understanding and agreement. Do not be afraid to ask questions regarding the care you are receiving. There are many different ways your caregivers will attempt to control your pain, including the following methods. °As needed pain control °· You may be given pain medicine either through your intravenous (IV) tube, or as a pill or  liquid you can swallow. You will need to let your caregiver know when you are having pain. Then, your caregiver will give you the pain medicine ordered for you. °· Your pain medicine may make you constipated. If constipation occurs, drink more liquids if you can. Your caregiver may have you take a mild laxative. °IV patient-controlled analgesia pump (PCA pump) °· You can get your pain medicine through the IV tube which goes into your vein. You are able to control the amount of pain medicine that you get. The pain medicine flows in through an IV tube and is controlled by a pump. This pump gives you a set amount of pain medicine when you push the button hooked up to it. Nobody should push this button but you or someone specifically assigned by you to do so. It is set up to keep you from accidentally giving yourself too much pain medicine. You will be able to start using your pain pump in the recovery room after your surgery. This method can be helpful for most types of surgery. °· If you are still having too much pain, tell your caregiver. Also, tell your caregiver if you are feeling too sleepy or nauseous. °Continuous epidural pain control °· A thin, soft tube (catheter) is put into your back. Pain medicine flows through the catheter to lessen pain in the part of your body where the surgery is done. Continuous epidural pain control may work best for you if you are having surgery on your chest, abdomen, hip area, or legs. The epidural catheter is usually put into your back just before surgery. The catheter is left in until you can eat and take medicine by mouth. In most cases,   this may take 2 to 3 days. °· Giving pain medicine through the epidural catheter may help you heal faster because: °¨ Your bowel gets back to normal faster. °¨ You can get back to eating sooner. °¨ You can be up and walking sooner. °Medicine that numbs the area (local anesthetic) °· You may receive an injection of pain medicine near where the  pain is (local infiltration). °· You may receive an injection of pain medicine near the nerve that controls the sensation to a specific part of the body (peripheral nerve block). °· Medicine may be put in the spine to block pain (spinal block). °Opioids °· Moderate to moderately severe acute pain after surgery may respond to opioids. Opioids are narcotic pain medicine. Opioids are often combined with non-narcotic medicines to improve pain relief, diminish the risk of side effects, and reduce the chance of addiction. °· If you follow your caregiver's directions about taking opioids and you do not have a history of substance abuse, your risk of becoming addicted is exceptionally small. Opioids are given for short periods of time in careful doses to prevent addiction. °Other methods of pain control include: °· Steroids. °· Physical therapy. °· Heat and cold therapy. °· Compression, such as wrapping an elastic bandage around the area of pain. °· Massage. °These various ways of controlling pain may be used together. Combining different methods of pain control is called multimodal analgesia. Using this approach has many benefits, including being able to eat, move around, and leave the hospital sooner. °Document Released: 07/08/2002 Document Revised: 07/10/2011 Document Reviewed: 07/12/2010 °[employer]® Patient Information ©2015 [employer]. This information is not intended to replace advice given to you by your health care provider. Make sure you discuss any questions you have with your health care provider. ° °

## 2013-12-28 NOTE — ED Provider Notes (Signed)
Medical screening examination/treatment/procedure(s) were performed by non-physician practitioner and as supervising physician I was immediately available for consultation/collaboration.   EKG Interpretation None       Doug Sou, MD 12/28/13 1721

## 2015-07-17 ENCOUNTER — Emergency Department (INDEPENDENT_AMBULATORY_CARE_PROVIDER_SITE_OTHER): Payer: Self-pay

## 2015-07-17 ENCOUNTER — Emergency Department (INDEPENDENT_AMBULATORY_CARE_PROVIDER_SITE_OTHER)
Admission: EM | Admit: 2015-07-17 | Discharge: 2015-07-17 | Disposition: A | Discharge status: Home / Self Care | Payer: Self-pay | Source: Home / Self Care | Attending: Family Medicine | Admitting: Family Medicine

## 2015-07-17 ENCOUNTER — Emergency Department (HOSPITAL_COMMUNITY): Payer: Self-pay

## 2015-07-17 ENCOUNTER — Encounter (HOSPITAL_COMMUNITY): Payer: Self-pay | Admitting: *Deleted

## 2015-07-17 DIAGNOSIS — S82891A Other fracture of right lower leg, initial encounter for closed fracture: Secondary | ICD-10-CM

## 2015-07-17 DIAGNOSIS — S93402A Sprain of unspecified ligament of left ankle, initial encounter: Secondary | ICD-10-CM

## 2015-07-17 DIAGNOSIS — W108XXA Fall (on) (from) other stairs and steps, initial encounter: Secondary | ICD-10-CM

## 2015-07-17 DIAGNOSIS — S93401A Sprain of unspecified ligament of right ankle, initial encounter: Secondary | ICD-10-CM

## 2015-07-17 MED ORDER — HYDROCODONE-ACETAMINOPHEN 5-325 MG PO TABS: 1.0000 | ORAL_TABLET | ORAL | Status: AC | PRN

## 2015-07-17 MED ORDER — IBUPROFEN 800 MG PO TABS
800.0000 mg | ORAL_TABLET | Freq: Once | ORAL | Status: AC
  Administered 2015-07-17: 800 mg via ORAL

## 2015-07-17 MED ORDER — IBUPROFEN 800 MG PO TABS
ORAL_TABLET | ORAL | Status: AC
  Filled 2015-07-17: qty 1

## 2015-07-17 NOTE — ED Notes (Signed)
Reports falling off porch while carrying heavy box 2 days ago, injuring bilat ankles.  [facility] resting, [facility], but continues with significant pain in bilat ankles - R>L.  Having difficulty bearing weight.  Right calcaneus & mid foot also tender to palpation.

## 2015-07-17 NOTE — Progress Notes (Signed)
Orthopedic Tech Progress Note Patient Details:  Maryann AlarCorey Mahoney [birth year] [id number]  Ortho Devices Type of Ortho Device: Ace wrap, Post (short leg) splint Ortho Device/Splint Location: RLE Ortho Device/Splint Interventions: Ordered, Application   Jennye MoccasinHughes, Jd Mccaster Craig 07/17/2015, 7:04 PM

## 2015-07-17 NOTE — ED Provider Notes (Signed)
CSN: [id number]     Arrival date & time 07/17/15  1619 History   First MD Initiated Contact with Patient 07/17/15 1751     Chief Complaint  Patient presents with  . Fall   (Consider location/radiation/quality/duration/timing/severity/associated sxs/prior Treatment) HPI Comments: 45 year old male was carrying heavy objects down steps 2 days ago when he fell to the ground. When he first fell he landed home both feet and describes both inversion and eversion type injury to the ankles. He states he crawled into the house and applied ice. For the past 2 days he has been applying ice and icy hot. He is now unable to bear partial weight to both lower extremities. His primary complaints are that of bilateral ankle pain and right foot pain. Pain is worse with weightbearing. He denies head injury, neck pain or injury or other extremity, trunk injury. He is fully awake and alert, denies neurologic symptoms or other musculoskeletal injury or pain.   Past Medical History  Diagnosis Date  . Asthma    History reviewed. No pertinent past surgical history. No family history on file. Social History  Substance Use Topics  . Smoking status: Current Every Day Smoker -- 0.25 packs/day    Types: Cigarettes  . Smokeless tobacco: None  . Alcohol Use: Yes     Comment: occasionally    Review of Systems  Constitutional: Positive for activity change. Negative for fever and fatigue.  HENT: Negative.   Respiratory: Negative.  Negative for cough and shortness of breath.   Cardiovascular: Negative.   Gastrointestinal: Negative.   Genitourinary: Negative.   Musculoskeletal: Positive for joint swelling and gait problem. Negative for back pain, neck pain and neck stiffness.       As per HPI  Skin: Negative.   Neurological: Negative for dizziness, weakness, numbness and headaches.  All other systems reviewed and are negative.   Allergies  Bee venom and Shrimp  Home Medications   Prior to Admission  medications   Medication Sig Start Date End Date Taking? Authorizing Provider  HYDROcodone-acetaminophen (NORCO/VICODIN) 5-325 MG tablet Take 1 tablet by mouth every 4 (four) hours as needed. 07/17/15   Hayden Rasmussenavid Saxton Chain, NP   Meds Ordered and Administered this Visit   Medications  ibuprofen (ADVIL,MOTRIN) tablet 800 mg (800 mg Oral Given 07/17/15 1750)    BP 155/96 mmHg  Pulse 84  Temp(Src) 97.9 F (36.6 C) (Oral)  Resp 14  SpO2 98% No data found.   Physical Exam  Constitutional: He is oriented to person, place, and time. He appears well-developed and well-nourished.  HENT:  Head: Normocephalic and atraumatic.  Eyes: Conjunctivae and EOM are normal. Left eye exhibits no discharge.  Neck: Normal range of motion. Neck supple.  Cardiovascular: Normal rate.   Pulmonary/Chest: Effort normal. No respiratory distress.  Musculoskeletal:  Right ankle with bimalleolar tenderness and swelling. No deformity. Tenderness to the Achilles tendon as well as to the proximal anterior foot. No pain to the distal foot or plantar aspect of the foot. Pulses 2+. Dorsiflexion and plantarflexion intact with pain.  Left ankle with bimalleolar tenderness and swelling. Tenderness to the posterior ankle. No deformity. No foot pain. Dorsiflexion and plantarflexion is brisk and intact. Distal neurovascular motor sensory bilaterally intact. Pedal pulses are 2+.  Neurological: He is alert and oriented to person, place, and time. No cranial nerve deficit.  Skin: Skin is warm and dry.  Psychiatric: He has a normal mood and affect.  Nursing note and vitals reviewed.   ED Course  Procedures (including critical care time)  Labs Review Labs Reviewed - No data to display  Imaging Review Dg Ankle Complete Left  07/17/2015  CLINICAL DATA:  Pain after fall EXAM: LEFT ANKLE COMPLETE - 3+ VIEW COMPARISON:  None. FINDINGS: Irregularity of the medial malleolus has a chronic appearance with no overlying soft tissue swelling.  No acute fracture or dislocation identified. IMPRESSION: No acute abnormality. Sequela of remote injury to the medial malleolus. Electronically Signed   By: Gerome Sam III M.D   On: 07/17/2015 18:18   Dg Ankle Complete Right  07/17/2015  CLINICAL DATA:  PT FELL UP THE STEPS Thursday MOVING FURNITURE, HE GOT HIS TOE CAUGHT ON THE STEP AND TWISTED WHEN HE FELL EXAM: RIGHT ANKLE - COMPLETE 3+ VIEW COMPARISON:  None. FINDINGS: Bone fragments identified along the posterior aspect of the tibia, raising question of an avulsion type injury. The medial and lateral malleolar are intact. The mortise is intact. IMPRESSION: Possibly avulsion fracture posteriorly. Electronically Signed   By: Norva Pavlov M.D.   On: 07/17/2015 18:05   Dg Foot Complete Right  07/17/2015  CLINICAL DATA:  Right foot pain after fall EXAM: RIGHT FOOT COMPLETE - 3+ VIEW COMPARISON:  None. FINDINGS: There is no definite evidence of fracture or dislocation. Well corticated linear osseous fragment posterior to the talus. There is no evidence of arthropathy or other focal bone abnormality. Soft tissues are unremarkable. IMPRESSION: No definite fracture or malalignment in the right foot. Well corticated linear osseous fragment posterior to the talus is favored to represent the sequela of prior injury, correlate with site of pain. Electronically Signed   By: Delbert Phenix M.D.   On: 07/17/2015 18:16     Visual Acuity Review  Right Eye Distance:   Left Eye Distance:   Bilateral Distance:    Right Eye Near:   Left Eye Near:    Bilateral Near:         MDM   1. Ankle sprain, left, initial encounter   2. Ankle sprain, right, initial encounter   3. Avulsion fracture of ankle, right, closed, initial encounter   4. Fall down steps, initial encounter    ASO splint to the left ankle. Short leg splint or a posterior splint to the right ankle. Rest, ice, elevation. Use crutches to limit weightbearing to the right lower  extremity. Follow-up orthopedist early next week. Call for appointment on Monday Meds ordered this encounter  Medications  . ibuprofen (ADVIL,MOTRIN) tablet 800 mg    Sig:   . HYDROcodone-acetaminophen (NORCO/VICODIN) 5-325 MG tablet    Sig: Take 1 tablet by mouth every 4 (four) hours as needed.    Dispense:  15 tablet    Refill:  0    Order Specific Question:  Supervising Provider    Answer:  Micheline Chapman       Hayden Rasmussen, NP 07/17/15 1859  Hayden Rasmussen, NP 07/17/15 1900

## 2015-07-17 NOTE — ED Notes (Signed)
Ortho tech notified of need for right posterior leg splint.

## 2015-07-17 NOTE — Discharge Instructions (Signed)
Ankle Sprain Use crutches to prevent weightbearing of the right lower extremity until after you see the orthopedist. Keep the foot and ankle elevated. He may apply ice for swelling. After you see the orthopedist he may decide to place a different type of immobilization device to the ankle that may be more functional and comfortable. Use the ASO wrapping ankle support to the left ankle. This can be removed temporarily to apply ice and for bathing. An ankle sprain is an injury to the strong, fibrous tissues (ligaments) that hold the bones of your ankle joint together.  CAUSES An ankle sprain is usually caused by a fall or by twisting your ankle. Ankle sprains most commonly occur when you step on the outer edge of your foot, and your ankle turns inward. People who participate in sports are more prone to these types of injuries.  SYMPTOMS   Pain in your ankle. The pain may be present at rest or only when you are trying to stand or walk.  Swelling.  Bruising. Bruising may develop immediately or within 1 to 2 days after your injury.  Difficulty standing or walking, particularly when turning corners or changing directions. DIAGNOSIS  Your caregiver will ask you details about your injury and perform a physical exam of your ankle to determine if you have an ankle sprain. During the physical exam, your caregiver will press on and apply pressure to specific areas of your foot and ankle. Your caregiver will try to move your ankle in certain ways. An X-ray exam may be done to be sure a bone was not broken or a ligament did not separate from one of the bones in your ankle (avulsion fracture).  TREATMENT  Certain types of braces can help stabilize your ankle. Your caregiver can make a recommendation for this. Your caregiver may recommend the use of medicine for pain. If your sprain is severe, your caregiver may refer you to a surgeon who helps to restore function to parts of your skeletal system (orthopedist) or  a physical therapist. HOME CARE INSTRUCTIONS   Apply ice to your injury for 1-2 days or as directed by your caregiver. Applying ice helps to reduce inflammation and pain.  Put ice in a plastic bag.  Place a towel between your skin and the bag.  Leave the ice on for 15-20 minutes at a time, every 2 hours while you are awake.  Only take over-the-counter or prescription medicines for pain, discomfort, or fever as directed by your caregiver.  Elevate your injured ankle above the level of your heart as much as possible for 2-3 days.  If your caregiver recommends crutches, use them as instructed. Gradually put weight on the affected ankle. Continue to use crutches or a cane until you can walk without feeling pain in your ankle.  If you have a plaster splint, wear the splint as directed by your caregiver. Do not rest it on anything harder than a pillow for the first 24 hours. Do not put weight on it. Do not get it wet. You may take it off to take a shower or bath.  You may have been given an elastic bandage to wear around your ankle to provide support. If the elastic bandage is too tight (you have numbness or tingling in your foot or your foot becomes cold and blue), adjust the bandage to make it comfortable.  If you have an air splint, you may blow more air into it or let air out to make it more  comfortable. You may take your splint off at night and before taking a shower or bath. Wiggle your toes in the splint several times per day to decrease swelling. SEEK MEDICAL CARE IF:   You have rapidly increasing bruising or swelling.  Your toes feel extremely cold or you lose feeling in your foot.  Your pain is not relieved with medicine. SEEK IMMEDIATE MEDICAL CARE IF:  Your toes are numb or blue.  You have severe pain that is increasing. MAKE SURE YOU:   Understand these instructions.  Will watch your condition.  Will get help right away if you are not doing well or get worse.   This  information is not intended to replace advice given to you by your health care provider. Make sure you discuss any questions you have with your health care provider.   Document Released: 04/17/2005 Document Revised: 05/08/2014 Document Reviewed: 04/29/2011 Elsevier Interactive Patient Education 2016 [employer]  Cast or Splint Care Casts and splints support injured limbs and keep bones from moving while they heal.  HOME CARE  Keep the cast or splint uncovered during the drying period.  A plaster cast can take 24 to 48 hours to dry.  A fiberglass cast will dry in less than 1 hour.  Do not rest the cast on anything harder than a pillow for 24 hours.  Do not put weight on your injured limb. Do not put pressure on the cast. Wait for your doctor's approval.  Keep the cast or splint dry.  Cover the cast or splint with a plastic bag during baths or wet weather.  If you have a cast over your chest and belly (trunk), take sponge baths until the cast is taken off.  If your cast gets wet, dry it with a towel or blow dryer. Use the cool setting on the blow dryer.  Keep your cast or splint clean. Wash a dirty cast with a damp cloth.  Do not put any objects under your cast or splint.  Do not scratch the skin under the cast with an object. If itching is a problem, use a blow dryer on a cool setting over the itchy area.  Do not trim or cut your cast.  Do not take out the padding from inside your cast.  Exercise your joints near the cast as told by your doctor.  Raise (elevate) your injured limb on 1 or 2 pillows for the first 1 to 3 days. GET HELP IF:  Your cast or splint cracks.  Your cast or splint is too tight or too loose.  You itch badly under the cast.  Your cast gets wet or has a soft spot.  You have a bad smell coming from the cast.  You get an object stuck under the cast.  Your skin around the cast becomes red or sore.  You have new or more pain after the cast is  put on. GET HELP RIGHT AWAY IF:  You have fluid leaking through the cast.  You cannot move your fingers or toes.  Your fingers or toes turn blue or white or are cool, painful, or puffy (swollen).  You have tingling or lose feeling (numbness) around the injured area.  You have bad pain or pressure under the cast.  You have trouble breathing or have shortness of breath.  You have chest pain.   This information is not intended to replace advice given to you by your health care provider. Make sure you discuss any questions  you have with your health care provider.   Document Released: 08/17/2010 Document Revised: 12/18/2012 Document Reviewed: 10/24/2012 Elsevier Interactive Patient Education [employer]

## 2015-11-27 ENCOUNTER — Emergency Department (HOSPITAL_COMMUNITY): Payer: Self-pay

## 2015-11-27 ENCOUNTER — Emergency Department (HOSPITAL_COMMUNITY)
Admission: EM | Admit: 2015-11-27 | Discharge: 2015-11-27 | Disposition: A | Discharge status: Home / Self Care | Payer: Self-pay | Attending: Emergency Medicine | Admitting: Emergency Medicine

## 2015-11-27 ENCOUNTER — Encounter (HOSPITAL_COMMUNITY): Payer: Self-pay | Admitting: Emergency Medicine

## 2015-11-27 DIAGNOSIS — J45909 Unspecified asthma, uncomplicated: Secondary | ICD-10-CM | POA: Insufficient documentation

## 2015-11-27 DIAGNOSIS — F17210 Nicotine dependence, cigarettes, uncomplicated: Secondary | ICD-10-CM | POA: Insufficient documentation

## 2015-11-27 DIAGNOSIS — R002 Palpitations: Secondary | ICD-10-CM | POA: Insufficient documentation

## 2015-11-27 DIAGNOSIS — Z79899 Other long term (current) drug therapy: Secondary | ICD-10-CM | POA: Insufficient documentation

## 2015-11-27 LAB — COMPREHENSIVE METABOLIC PANEL
ALBUMIN: 4.5 g/dL (ref 3.5–5.0)
ALT: 23 U/L (ref 17–63)
AST: 17 U/L (ref 15–41)
Alkaline Phosphatase: 84 U/L (ref 38–126)
Anion gap: 10 (ref 5–15)
BUN: 8 mg/dL (ref 6–20)
CHLORIDE: 108 mmol/L (ref 101–111)
CO2: 23 mmol/L (ref 22–32)
CREATININE: 1.3 mg/dL — AB (ref 0.61–1.24)
Calcium: 9.4 mg/dL (ref 8.9–10.3)
GFR calc Af Amer: >60 mL/min (ref >60)
GFR calc non Af Amer: >60 mL/min (ref >60)
GLUCOSE: 120 mg/dL — AB (ref 65–99)
Potassium: 3.6 mmol/L (ref 3.5–5.1)
SODIUM: 141 mmol/L (ref 135–145)
Total Bilirubin: 0.5 mg/dL (ref 0.3–1.2)
Total Protein: 8 g/dL (ref 6.5–8.1)

## 2015-11-27 LAB — CBC WITH DIFFERENTIAL/PLATELET
Basophils Absolute: 0.1 10*3/uL (ref 0.0–0.1)
Basophils Relative: 1 %
EOS ABS: 0.1 10*3/uL (ref 0.0–0.7)
EOS PCT: 1 %
HCT: 44.7 % (ref 39.0–52.0)
Hemoglobin: 15.3 g/dL (ref 13.0–17.0)
LYMPHS ABS: 5.9 10*3/uL — AB (ref 0.7–4.0)
Lymphocytes Relative: 51 %
MCH: 31.5 pg (ref 26.0–34.0)
MCHC: 34.2 g/dL (ref 30.0–36.0)
MCV: 92 fL (ref 78.0–100.0)
MONO ABS: 0.6 10*3/uL (ref 0.1–1.0)
MONOS PCT: 5 %
Neutro Abs: 4.7 10*3/uL (ref 1.7–7.7)
Neutrophils Relative %: 42 %
Platelets: 257 10*3/uL (ref 150–400)
RBC: 4.86 MIL/uL (ref 4.22–5.81)
RDW: 14 % (ref 11.5–15.5)
WBC: 11.3 10*3/uL — ABNORMAL HIGH (ref 4.0–10.5)

## 2015-11-27 LAB — RAPID URINE DRUG SCREEN, HOSP PERFORMED
Amphetamines: NOT DETECTED
Barbiturates: NOT DETECTED
Benzodiazepines: NOT DETECTED
Cocaine: POSITIVE — AB
OPIATES: NOT DETECTED
TETRAHYDROCANNABINOL: POSITIVE — AB

## 2015-11-27 LAB — TROPONIN I: Troponin I: <0.03 ng/mL (ref <0.03)

## 2015-11-27 MED ORDER — SODIUM CHLORIDE 0.9 % IV BOLUS (SEPSIS)
2000.0000 mL | Freq: Once | INTRAVENOUS | Status: AC
  Administered 2015-11-27: 2000 mL via INTRAVENOUS

## 2015-11-27 NOTE — ED Provider Notes (Signed)
[facility]-EMERGENCY DEPT Provider Note   CSN: [id number] Arrival date & time: 11/27/15  7106  First Provider Contact:  First MD Initiated Contact with Patient 11/27/15 [phone]        History   Chief Complaint Chief Complaint  Patient presents with  . Chest Pain    HPI Casey Swanson is a 45 y.o. male.  Patient without significant medical history presents with a sense of heart pounding causing chest pressure earlier this evening. He reports being at cook out today and drinking alcohol and smoking marijuana through the day. He denies other substance use. No vomiting or trouble breathing. Symptoms have been constant since onset without alleviating or aggravating factors.    The history is provided by the patient. No language interpreter was used.    Past Medical History:  Diagnosis Date  . Asthma     There are no active problems to display for this patient.   History reviewed. No pertinent surgical history.     Home Medications    Prior to Admission medications   Medication Sig Start Date End Date Taking? Authorizing Provider  albuterol (PROVENTIL HFA;VENTOLIN HFA) 108 (90 Base) MCG/ACT inhaler Inhale 1-2 puffs into the lungs every 6 (six) hours as needed for wheezing or shortness of breath.   Yes Historical Provider, MD  OVER THE COUNTER MEDICATION Take 2 capsules by mouth 2 (two) times daily. NUGENIX (Testofen, L-Citrulline Malate and Tribulus terrestris)   Yes Historical Provider, MD  HYDROcodone-acetaminophen (NORCO/VICODIN) 5-325 MG tablet Take 1 tablet by mouth every 4 (four) hours as needed. Patient not taking: Reported on 11/27/2015 07/17/15   Hayden Rasmussen, NP    Family History No family history on file.  Social History Social History  Substance Use Topics  . Smoking status: Current Every Day Smoker    Packs/day: 0.25    Types: Cigarettes  . Smokeless tobacco: Never Used  . Alcohol use Yes     Comment: occasionally     Allergies   Bee venom and Shrimp  [shellfish allergy]   Review of Systems Review of Systems  Constitutional: Negative for chills and fever.  HENT: Negative.   Respiratory: Positive for chest tightness. Negative for cough and shortness of breath.   Cardiovascular: Positive for palpitations.  Gastrointestinal: Negative.  Negative for nausea and vomiting.  Musculoskeletal: Negative.  Negative for myalgias.  Neurological: Negative.      Physical Exam Updated Vital Signs BP 138/82 (BP Location: Left Arm)   Pulse (!) 123   Temp 98.3 F (36.8 C) (Oral)   Resp 18   Ht 6' (1.829 m)   Wt 108.9 kg   SpO2 96%   BMI 32.55 kg/m   Physical Exam  Constitutional: He appears well-developed and well-nourished.  HENT:  Head: Normocephalic.  Eyes: Conjunctivae are normal.  Neck: Normal range of motion. Neck supple.  Cardiovascular: Regular rhythm.  Tachycardia present.   Pulmonary/Chest: Effort normal and breath sounds normal.  Abdominal: Soft. Bowel sounds are normal. There is no tenderness. There is no rebound and no guarding.  Musculoskeletal: Normal range of motion.  Neurological: He is alert. No cranial nerve deficit.  Skin: Skin is warm and dry. No rash noted. He is not diaphoretic.  Psychiatric: He has a normal mood and affect.  Nursing note and vitals reviewed.    ED Treatments / Results  Labs (all labs ordered are listed, but only abnormal results are displayed) Labs Reviewed  COMPREHENSIVE METABOLIC PANEL - Abnormal; Notable for the following:  Result Value   Glucose, Bld 120 (*)    Creatinine, Ser 1.30 (*)    All other components within normal limits  CBC WITH DIFFERENTIAL/PLATELET - Abnormal; Notable for the following:    WBC 11.3 (*)    Lymphs Abs 5.9 (*)    All other components within normal limits  TROPONIN I  URINE RAPID DRUG SCREEN, HOSP PERFORMED    EKG  EKG Interpretation  Date/Time:  Saturday November 27 2015 03:06:39 EDT Ventricular Rate:  127 PR Interval:    QRS Duration: 89 QT  Interval:  304 QTC Calculation: 442 R Axis:   -29 Text Interpretation:  Sinus tachycardia Borderline left axis deviation Confirmed by [facility]  MD, APRIL ([id number]) on 11/27/2015 3:10:21 AM       Radiology Dg Chest 2 View  Result Date: 11/27/2015 CLINICAL DATA:  45 year old male with palpitations EXAM: CHEST  2 VIEW COMPARISON:  None. FINDINGS: The heart size and mediastinal contours are within normal limits. Both lungs are clear. The visualized skeletal structures are unremarkable. IMPRESSION: No active cardiopulmonary disease. Electronically Signed   By: Elgie Collard M.D.   On: 11/27/2015 04:34   Procedures Procedures (including critical care time)  Medications Ordered in ED Medications  sodium chloride 0.9 % bolus 2,000 mL (2,000 mLs Intravenous New Bag/Given 11/27/15 0358)     Initial Impression / Assessment and Plan / ED Course  I have reviewed the triage vital signs and the nursing notes.  Pertinent labs & imaging results that were available during my care of the patient were reviewed by me and considered in my medical decision making (see chart for details).  Clinical Course    Patient presents with sense of heart pounding. No lightheadedness or near syncope. He had chest tightness.   IVF's given with resolution of symptoms. Heart rate normalizes. The patient had been drinking throughout the day. DDx: dehydration vs cocaine use. He is asymptomatic and felt stable for discharge home.   Final Clinical Impressions(s) / ED Diagnoses   Final diagnoses:  None  1. palpitations  New Prescriptions New Prescriptions   No medications on file     Danne Harbor 11/27/15 5621    April Palumbo, MD 11/27/15 [phone]

## 2015-11-27 NOTE — ED Triage Notes (Signed)
Pt presents with onset of L sided chest pressure x 45 min with palpitations. Pt states pressure has now resolved, +shob, denies n/v/d, denies diaphoresis. Pt has been drinking etoh, denies drug use.

## 2017-03-26 ENCOUNTER — Encounter (HOSPITAL_COMMUNITY): Payer: Self-pay | Admitting: Emergency Medicine

## 2017-03-26 ENCOUNTER — Emergency Department (HOSPITAL_COMMUNITY)
Admission: EM | Admit: 2017-03-26 | Discharge: 2017-03-26 | Disposition: A | Discharge status: Home / Self Care | Payer: Self-pay | Attending: Emergency Medicine | Admitting: Emergency Medicine

## 2017-03-26 ENCOUNTER — Emergency Department (HOSPITAL_COMMUNITY): Payer: Self-pay

## 2017-03-26 DIAGNOSIS — F17210 Nicotine dependence, cigarettes, uncomplicated: Secondary | ICD-10-CM | POA: Insufficient documentation

## 2017-03-26 DIAGNOSIS — L03012 Cellulitis of left finger: Secondary | ICD-10-CM | POA: Insufficient documentation

## 2017-03-26 DIAGNOSIS — J45909 Unspecified asthma, uncomplicated: Secondary | ICD-10-CM | POA: Insufficient documentation

## 2017-03-26 DIAGNOSIS — Z23 Encounter for immunization: Secondary | ICD-10-CM | POA: Insufficient documentation

## 2017-03-26 DIAGNOSIS — Z79899 Other long term (current) drug therapy: Secondary | ICD-10-CM | POA: Insufficient documentation

## 2017-03-26 MED ORDER — NAPROXEN 500 MG PO TABS
500.0000 mg | ORAL_TABLET | Freq: Two times a day (BID) | ORAL | 0 refills | Status: AC
Start: 2017-03-26 — End: ?

## 2017-03-26 MED ORDER — SULFAMETHOXAZOLE-TRIMETHOPRIM 800-160 MG PO TABS: 1.0000 | ORAL_TABLET | Freq: Two times a day (BID) | ORAL | 0 refills | Status: AC

## 2017-03-26 MED ORDER — SULFAMETHOXAZOLE-TRIMETHOPRIM 800-160 MG PO TABS
1.0000 | ORAL_TABLET | Freq: Once | ORAL | Status: AC
  Administered 2017-03-26: 1 via ORAL
  Filled 2017-03-26: qty 1

## 2017-03-26 MED ORDER — TETANUS-DIPHTH-ACELL PERTUSSIS 5-2.5-18.5 LF-MCG/0.5 IM SUSP
0.5000 mL | Freq: Once | INTRAMUSCULAR | Status: AC
  Administered 2017-03-26: 0.5 mL via INTRAMUSCULAR
  Filled 2017-03-26: qty 0.5

## 2017-03-26 MED ORDER — HYDROCODONE-ACETAMINOPHEN 5-325 MG PO TABS
1.0000 | ORAL_TABLET | Freq: Once | ORAL | Status: AC
Start: 2017-03-26 — End: 2017-03-26
  Administered 2017-03-26: 1 via ORAL
  Filled 2017-03-26: qty 1

## 2017-03-26 NOTE — ED Triage Notes (Addendum)
Pt reports L middle fingertip swelling for the past few days. No injury. Swelling extends down to medial finger joint. No red streaks noted.

## 2017-03-26 NOTE — ED Provider Notes (Signed)
[facility]-EMERGENCY DEPT Provider Note   CSN: [id number] Arrival date & time: 03/26/17  1015     History   Chief Complaint Chief Complaint  Patient presents with  . finger swelling    HPI Casey Swanson is a 46 y.o. male who presents to the ED with finger pain and swelling. The pain and swelling is to the left middle finger. The symptoms started 5 days ago and has gotten worse quickly. The area is around the nail. Patient has been taking ibuprofen 800 mg.   HPI  Past Medical History:  Diagnosis Date  . Asthma     There are no active problems to display for this patient.   History reviewed. No pertinent surgical history.     Home Medications    Prior to Admission medications   Medication Sig Start Date End Date Taking? Authorizing Provider  albuterol (PROVENTIL HFA;VENTOLIN HFA) 108 (90 Base) MCG/ACT inhaler Inhale 1-2 puffs into the lungs every 6 (six) hours as needed for wheezing or shortness of breath.    [provider]  HYDROcodone-acetaminophen (NORCO/VICODIN) 5-325 MG tablet Take 1 tablet by mouth every 4 (four) hours as needed. Patient not taking: Reported on 11/27/2015 07/17/15   Hayden RasmussenMabe, David, NP  naproxen (NAPROSYN) 500 MG tablet Take 1 tablet (500 mg total) by mouth 2 (two) times daily. 03/26/17   Janne NapoleonNeese, Hope M, NP  OVER THE COUNTER MEDICATION Take 2 capsules by mouth 2 (two) times daily. NUGENIX (Testofen, L-Citrulline Malate and Tribulus terrestris)    [provider]  sulfamethoxazole-trimethoprim (BACTRIM DS,SEPTRA DS) 800-160 MG tablet Take 1 tablet by mouth 2 (two) times daily for 7 days. 03/26/17 04/02/17  Janne NapoleonNeese, Hope M, NP    Family History History reviewed. No pertinent family history.  Social History Social History   Tobacco Use  . Smoking status: Current Every Day Smoker    Packs/day: 0.25    Types: Cigarettes  . Smokeless tobacco: Never Used  Substance Use Topics  . Alcohol use: Yes    Comment:  occasionally  . Drug use: No     Allergies   Bee venom and Shrimp [shellfish allergy]   Review of Systems Review of Systems  Constitutional: Positive for chills. Negative for fever.  HENT: Negative.   Eyes: Negative.   Respiratory: Negative for cough and wheezing.   Cardiovascular: Negative for chest pain.  Gastrointestinal: Negative for abdominal pain, diarrhea, nausea and vomiting.  Genitourinary: Negative for dysuria, frequency and urgency.  Musculoskeletal: Positive for arthralgias.       Left middle finger  Skin: Positive for color change and wound.  Neurological: Negative for headaches.  Psychiatric/Behavioral: Negative for confusion.     Physical Exam Updated Vital Signs BP 135/82 (BP Location: Left Arm)   Pulse 72   Temp 98.1 F (36.7 C) (Oral)   Resp 18   Ht 5\' 11"  (1.803 m)   Wt 104.3 kg (230 lb)   SpO2 100%   BMI 32.08 kg/m   Physical Exam  Constitutional: He appears well-developed and well-nourished. No distress.  Eyes: EOM are normal.  Neck: Neck supple.  Cardiovascular: Normal rate.  Pulmonary/Chest: Effort normal.  Musculoskeletal:       Left hand: He exhibits tenderness and swelling. He exhibits normal range of motion, normal capillary refill and no deformity. Decreased sensation noted. Normal strength noted. He exhibits no thumb/finger opposition.  Left middle thumb with swelling and erythema surrounding the nail. C/w paronychia.   Neurological: He is alert.  Skin: Skin is warm and dry.  Psychiatric: He has a normal mood and affect. His behavior is normal.  Nursing note and vitals reviewed.    ED Treatments / Results  Labs (all labs ordered are listed, but only abnormal results are displayed) Labs Reviewed - No data to display  Radiology Dg Finger Middle Left  Result Date: 03/26/2017 CLINICAL DATA:  Distal phalangeal pain of left ring finger EXAM: LEFT MIDDLE FINGER 2+V COMPARISON:  None. FINDINGS: There is no evidence of fracture or  dislocation. There is no evidence of arthropathy or other focal bone abnormality. Soft tissues are unremarkable. No radiopaque foreign body. IMPRESSION: Negative. Electronically Signed   By: Charlett NoseKevin  Dover M.D.   On: 03/26/2017 14:57    Procedures .Marland Kitchen.Incision and Drainage Date/Time: 03/26/2017 3:29 PM Performed by: Janne NapoleonNeese, Hope M, NP Authorized by: Janne NapoleonNeese, Hope M, NP   Consent:    Consent obtained:  Verbal   Consent given by:  Patient   Risks discussed:  Bleeding and incomplete drainage Location:    Indications for incision and drainage: paronychia.   Location:  Upper extremity   Upper extremity location:  Finger   Finger location:  L long finger Pre-procedure details:    Skin preparation:  Betadine Anesthesia (see MAR for exact dosages):    Anesthesia method:  None Procedure type:    Complexity:  Complex Procedure details:    Needle aspiration: no     Incision types:  Single straight   Incision depth:  Dermal   Scalpel blade:  11   Wound management:  Probed and deloculated and irrigated with saline   Drainage:  Purulent   Drainage amount:  Moderate   Wound treatment:  Wound left open Post-procedure details:    Patient tolerance of procedure:  Tolerated well, no immediate complications   (including critical care time)  Medications Ordered in ED Medications  HYDROcodone-acetaminophen (NORCO/VICODIN) 5-325 MG per tablet 1 tablet (not administered)  sulfamethoxazole-trimethoprim (BACTRIM DS,SEPTRA DS) 800-160 MG per tablet 1 tablet (not administered)     Initial Impression / Assessment and Plan / ED Course  I have reviewed the triage vital signs and the nursing notes.    Final Clinical Impressions(s) / ED Diagnoses  46 y.o. male with swelling and pain to the left middle finger stable for d/c with good results from I&D will start antibiotics and NSAIDS. No red streaking, no fever and patient does not appear toxic. X-rays without fracture, f/b or osteo.  Final diagnoses:    Paronychia of finger of left hand    ED Discharge Orders        Ordered    sulfamethoxazole-trimethoprim (BACTRIM DS,SEPTRA DS) 800-160 MG tablet  2 times daily     03/26/17 1525    naproxen (NAPROSYN) 500 MG tablet  2 times daily     03/26/17 1525       Damian Leavelleese, [place] M, [place]NP 03/26/17 1534    Linwood DibblesKnapp, Jon, MD 03/27/17 1238

## 2017-03-26 NOTE — Discharge Instructions (Signed)
Soak the finger in warm water and wash it with antibacterial soap. Take the antibiotic as directed. Return if symptoms worsen.

## 2019-07-01 IMAGING — CR DG FINGER MIDDLE 2+V*L*
3 series · 3 of 3 positions shown · non-contrast
Comparison: None.

CLINICAL DATA: Distal phalangeal pain of left ring finger

EXAM:
LEFT MIDDLE FINGER 2+V

[x finger pa left]
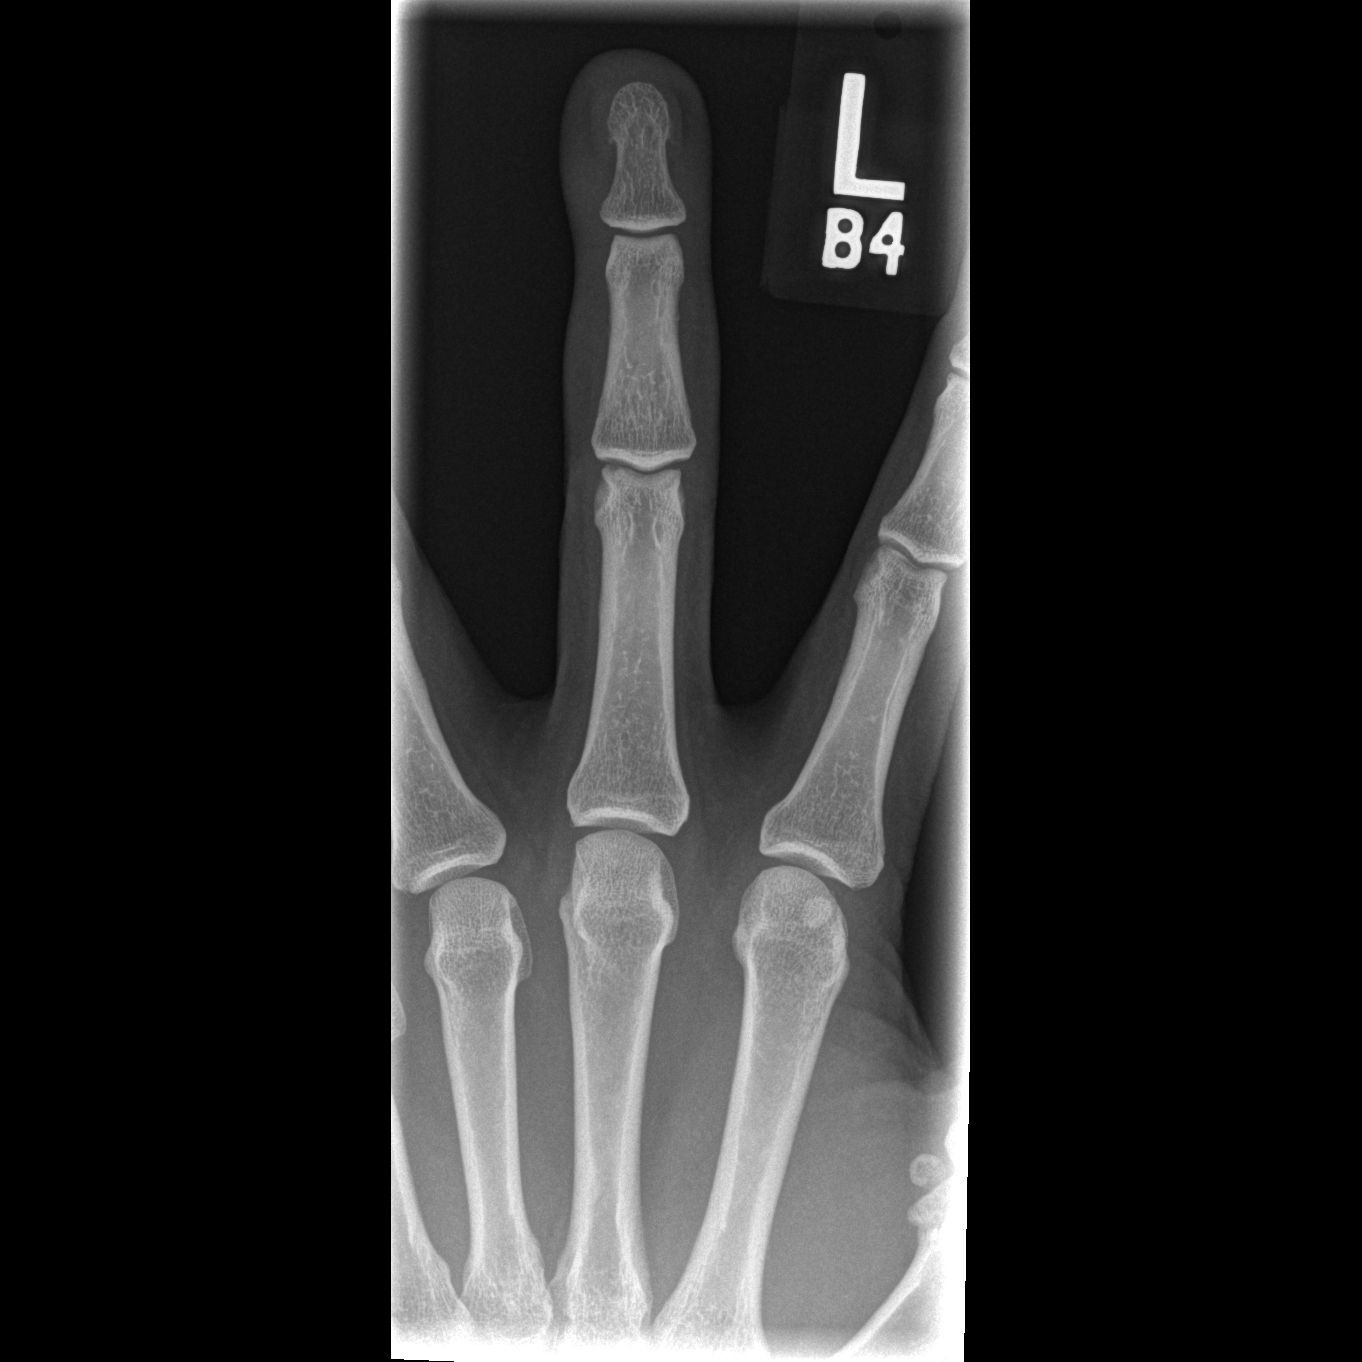

[x finger obl. left]
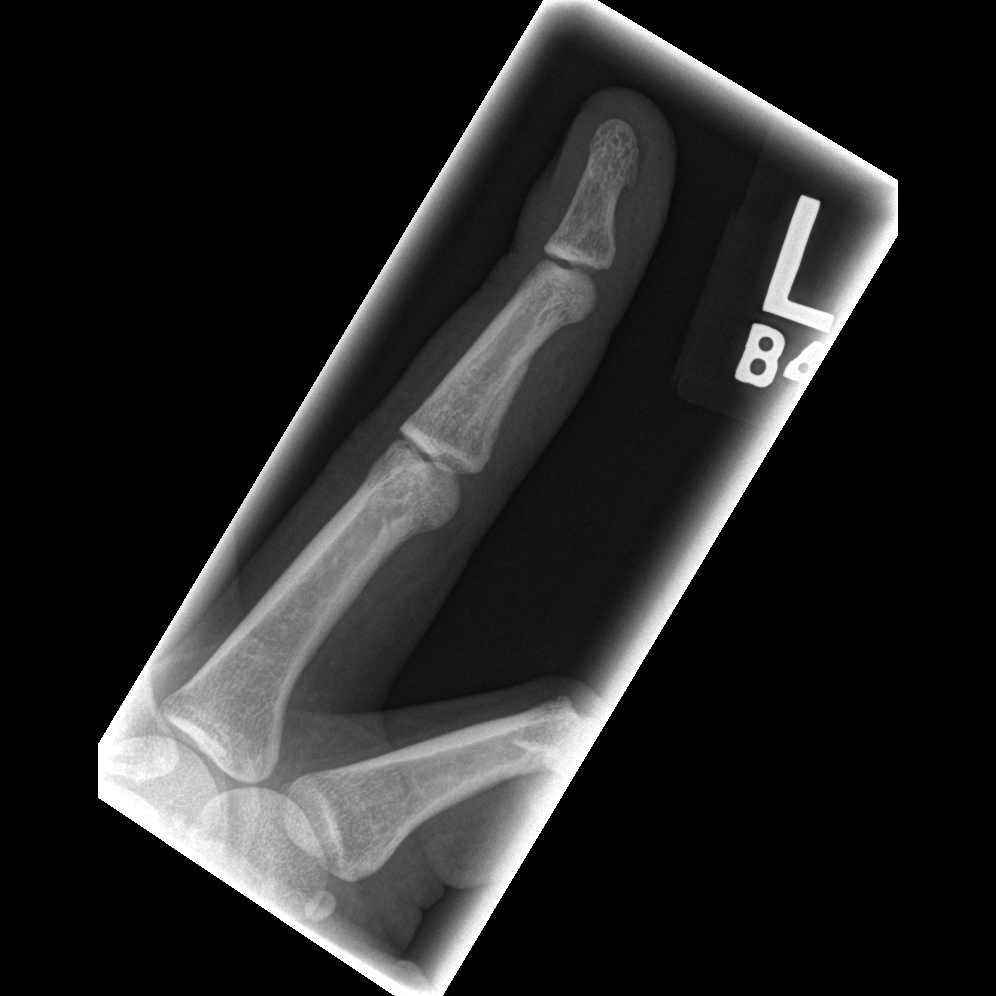

[x finger lateral left]
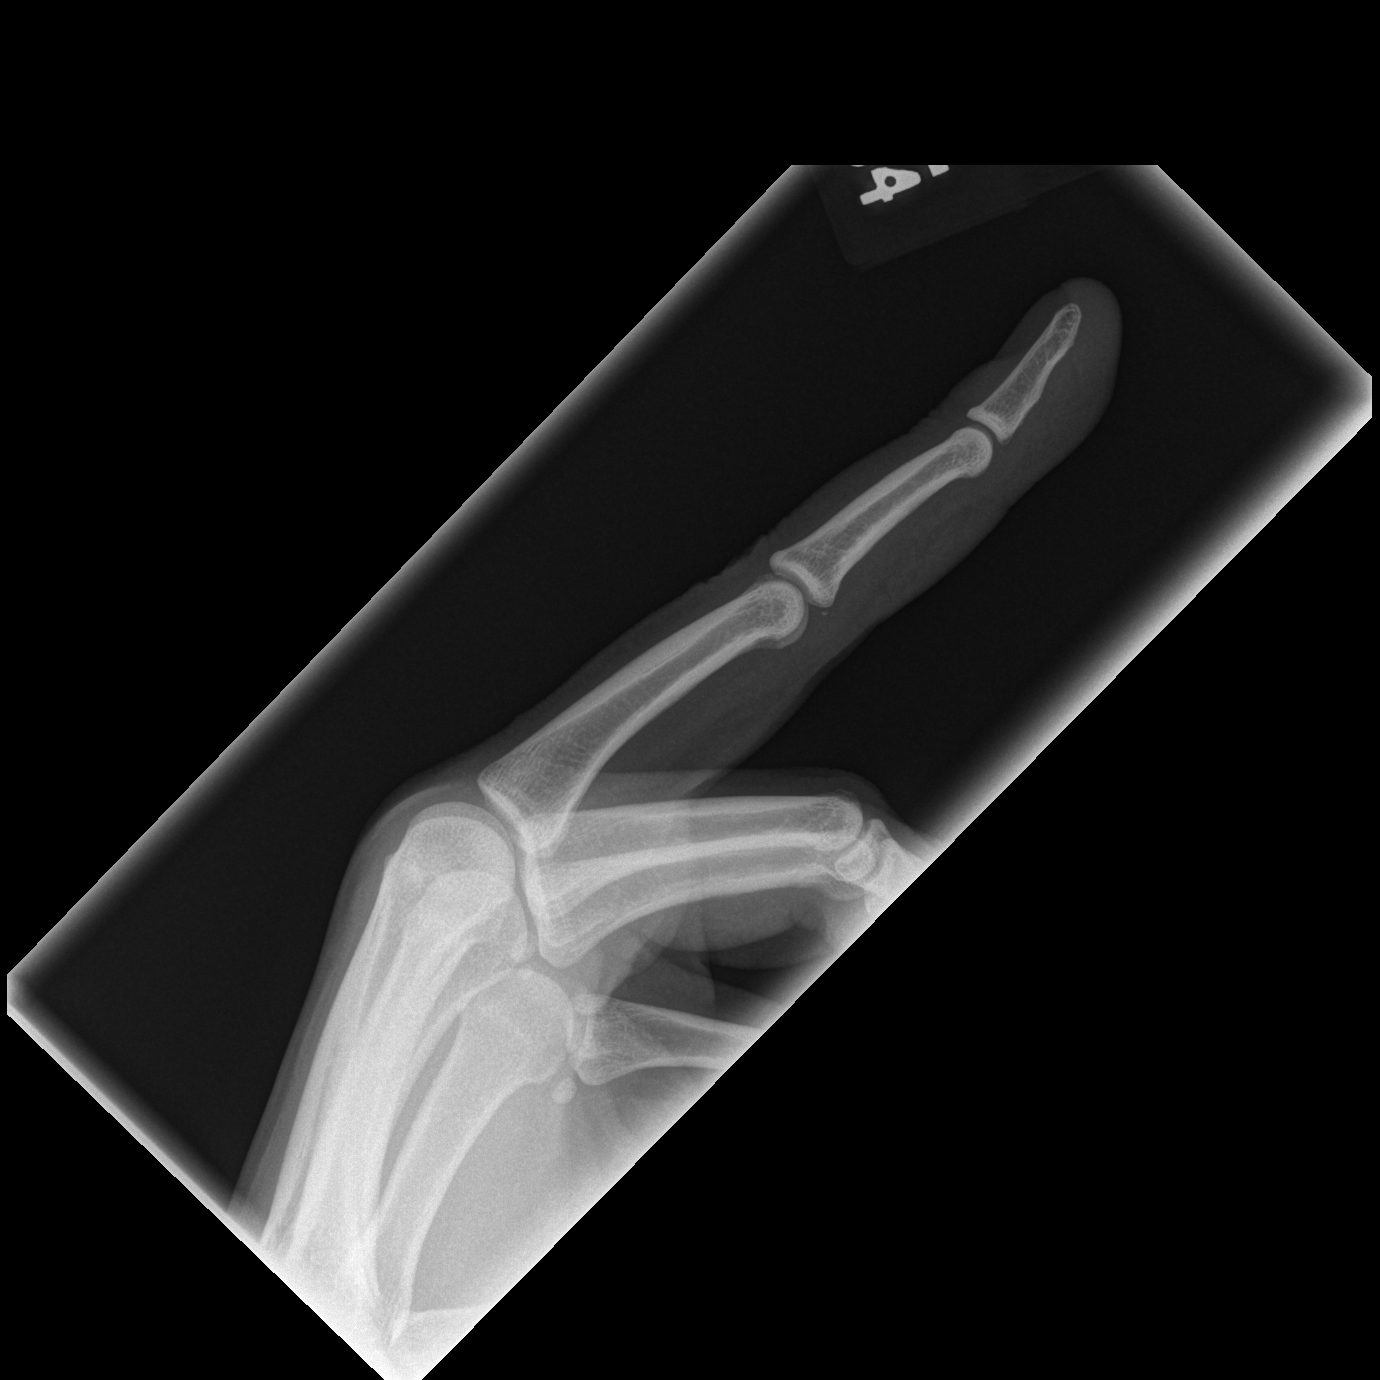

[3 of 3 positions shown; findings below may reference images not displayed]

FINDINGS: There is no evidence of fracture or dislocation. There is no
evidence of arthropathy or other focal bone abnormality. Soft
tissues are unremarkable. No radiopaque foreign body.
IMPRESSION: Negative.
